# Patient Record
Sex: Male | Born: 1960 | Race: Black or African American | Hispanic: No | Marital: Single | Smoking: Never smoker
Health system: Southern US, Community
[De-identification: ages and names within clinical notes are randomized; demographics above are authoritative.]

## PROBLEM LIST (undated history)

## (undated) DIAGNOSIS — I10 Essential (primary) hypertension: Secondary | ICD-10-CM

## (undated) DIAGNOSIS — B20 Human immunodeficiency virus [HIV] disease: Secondary | ICD-10-CM

## (undated) DIAGNOSIS — M1990 Unspecified osteoarthritis, unspecified site: Secondary | ICD-10-CM

## (undated) DIAGNOSIS — C61 Malignant neoplasm of prostate: Secondary | ICD-10-CM

## (undated) HISTORY — PX: NO PAST SURGERIES: SHX2092

## (undated) HISTORY — PX: COLONOSCOPY: SHX174

## (undated) HISTORY — PX: TONSILLECTOMY: SUR1361

## (undated) HISTORY — DX: Malignant neoplasm of prostate: C61

---

## 2005-04-26 ENCOUNTER — Emergency Department: Payer: Self-pay | Admitting: Unknown Physician Specialty

## 2007-12-04 ENCOUNTER — Ambulatory Visit: Payer: Self-pay | Admitting: Family Medicine

## 2011-04-30 DIAGNOSIS — B20 Human immunodeficiency virus [HIV] disease: Secondary | ICD-10-CM | POA: Insufficient documentation

## 2012-05-31 ENCOUNTER — Ambulatory Visit: Payer: Self-pay | Admitting: Emergency Medicine

## 2013-03-05 ENCOUNTER — Ambulatory Visit: Payer: Self-pay | Admitting: Family Medicine

## 2013-03-24 ENCOUNTER — Ambulatory Visit: Payer: Self-pay | Admitting: Family Medicine

## 2013-04-15 DIAGNOSIS — E785 Hyperlipidemia, unspecified: Secondary | ICD-10-CM | POA: Insufficient documentation

## 2013-04-15 DIAGNOSIS — I10 Essential (primary) hypertension: Secondary | ICD-10-CM | POA: Insufficient documentation

## 2013-04-17 DIAGNOSIS — S83289A Other tear of lateral meniscus, current injury, unspecified knee, initial encounter: Secondary | ICD-10-CM | POA: Insufficient documentation

## 2014-05-28 DIAGNOSIS — M23329 Other meniscus derangements, posterior horn of medial meniscus, unspecified knee: Secondary | ICD-10-CM | POA: Insufficient documentation

## 2014-07-09 DIAGNOSIS — L819 Disorder of pigmentation, unspecified: Secondary | ICD-10-CM | POA: Insufficient documentation

## 2014-12-17 DIAGNOSIS — K6282 Dysplasia of anus: Secondary | ICD-10-CM | POA: Insufficient documentation

## 2015-02-03 ENCOUNTER — Emergency Department
Admit: 2015-02-03 | Disposition: A | Discharge status: Home / Self Care | Payer: Self-pay | Admitting: Emergency Medicine

## 2016-07-31 DIAGNOSIS — Z86010 Personal history of colonic polyps: Secondary | ICD-10-CM | POA: Insufficient documentation

## 2017-10-11 DIAGNOSIS — N528 Other male erectile dysfunction: Secondary | ICD-10-CM | POA: Insufficient documentation

## 2018-07-26 ENCOUNTER — Observation Stay
Admission: EM | Admit: 2018-07-26 | Discharge: 2018-07-27 | Disposition: A | Discharge status: Home / Self Care | Payer: BLUE CROSS/BLUE SHIELD | Attending: Internal Medicine | Admitting: Internal Medicine

## 2018-07-26 ENCOUNTER — Emergency Department: Payer: BLUE CROSS/BLUE SHIELD

## 2018-07-26 ENCOUNTER — Other Ambulatory Visit: Payer: Self-pay

## 2018-07-26 DIAGNOSIS — I10 Essential (primary) hypertension: Secondary | ICD-10-CM | POA: Diagnosis not present

## 2018-07-26 DIAGNOSIS — B20 Human immunodeficiency virus [HIV] disease: Secondary | ICD-10-CM | POA: Insufficient documentation

## 2018-07-26 DIAGNOSIS — Z79899 Other long term (current) drug therapy: Secondary | ICD-10-CM | POA: Diagnosis not present

## 2018-07-26 DIAGNOSIS — R079 Chest pain, unspecified: Secondary | ICD-10-CM | POA: Diagnosis present

## 2018-07-26 DIAGNOSIS — R0789 Other chest pain: Secondary | ICD-10-CM | POA: Diagnosis not present

## 2018-07-26 DIAGNOSIS — Z23 Encounter for immunization: Secondary | ICD-10-CM | POA: Diagnosis not present

## 2018-07-26 HISTORY — DX: Essential (primary) hypertension: I10

## 2018-07-26 LAB — HEPATIC FUNCTION PANEL
ALT: 16 U/L (ref 0–44)
AST: 21 U/L (ref 15–41)
Albumin: 4 g/dL (ref 3.5–5.0)
Alkaline Phosphatase: 53 U/L (ref 38–126)
Bilirubin, Direct: 0.2 mg/dL (ref 0.0–0.2)
Indirect Bilirubin: 0.7 mg/dL (ref 0.3–0.9)
Total Bilirubin: 0.9 mg/dL (ref 0.3–1.2)
Total Protein: 6.9 g/dL (ref 6.5–8.1)

## 2018-07-26 LAB — CBC
HEMATOCRIT: 42.5 % (ref 40.0–52.0)
HEMOGLOBIN: 14.7 g/dL (ref 13.0–18.0)
MCH: 31.7 pg (ref 26.0–34.0)
MCHC: 34.7 g/dL (ref 32.0–36.0)
MCV: 91.5 fL (ref 80.0–100.0)
Platelets: 215 10*3/uL (ref 150–440)
RBC: 4.64 MIL/uL (ref 4.40–5.90)
RDW: 14.4 % (ref 11.5–14.5)
WBC: 4.5 10*3/uL (ref 3.8–10.6)

## 2018-07-26 LAB — TROPONIN I: Troponin I: <0.03 ng/mL (ref <0.03)

## 2018-07-26 LAB — BASIC METABOLIC PANEL
Anion gap: 12 (ref 5–15)
BUN: 12 mg/dL (ref 6–20)
CO2: 20 mmol/L — ABNORMAL LOW (ref 22–32)
Calcium: 9 mg/dL (ref 8.9–10.3)
Chloride: 104 mmol/L (ref 98–111)
Creatinine, Ser: 1.17 mg/dL (ref 0.61–1.24)
GFR calc Af Amer: >60 mL/min (ref >60)
GFR calc non Af Amer: >60 mL/min (ref >60)
Glucose, Bld: 109 mg/dL — ABNORMAL HIGH (ref 70–99)
Potassium: 3.9 mmol/L (ref 3.5–5.1)
Sodium: 136 mmol/L (ref 135–145)

## 2018-07-26 LAB — LIPASE, BLOOD: Lipase: 36 U/L (ref 11–51)

## 2018-07-26 MED ORDER — INFLUENZA VAC SPLIT QUAD 0.5 ML IM SUSY
0.5000 mL | PREFILLED_SYRINGE | INTRAMUSCULAR | Status: AC
  Administered 2018-07-27: 0.5 mL via INTRAMUSCULAR
  Filled 2018-07-26: qty 0.5

## 2018-07-26 MED ORDER — HYDRALAZINE HCL 20 MG/ML IJ SOLN: 5.0000 mg | INTRAMUSCULAR | Status: DC | PRN

## 2018-07-26 MED ORDER — METOPROLOL TARTRATE 25 MG PO TABS
25.0000 mg | ORAL_TABLET | Freq: Two times a day (BID) | ORAL | Status: DC
  Administered 2018-07-26 – 2018-07-27 (×2): 25 mg via ORAL
  Filled 2018-07-26 (×2): qty 1

## 2018-07-26 MED ORDER — ASPIRIN 81 MG PO CHEW
81.0000 mg | CHEWABLE_TABLET | Freq: Every day | ORAL | Status: DC
  Administered 2018-07-27: 81 mg via ORAL
  Filled 2018-07-26: qty 1

## 2018-07-26 MED ORDER — ABACAVIR-DOLUTEGRAVIR-LAMIVUD 600-50-300 MG PO TABS
1.0000 | ORAL_TABLET | Freq: Every day | ORAL | Status: DC
  Administered 2018-07-26: 1 via ORAL
  Filled 2018-07-26 (×2): qty 1

## 2018-07-26 MED ORDER — GI COCKTAIL ~~LOC~~
30.0000 mL | Freq: Four times a day (QID) | ORAL | Status: DC | PRN
  Filled 2018-07-26: qty 30

## 2018-07-26 MED ORDER — SODIUM CHLORIDE 0.9% FLUSH
3.0000 mL | Freq: Two times a day (BID) | INTRAVENOUS | Status: DC
  Administered 2018-07-26: 3 mL via INTRAVENOUS

## 2018-07-26 MED ORDER — ACETAMINOPHEN 325 MG PO TABS: 650.0000 mg | ORAL_TABLET | ORAL | Status: DC | PRN

## 2018-07-26 MED ORDER — ONDANSETRON HCL 4 MG/2ML IJ SOLN: 4.0000 mg | Freq: Four times a day (QID) | INTRAMUSCULAR | Status: DC | PRN

## 2018-07-26 MED ORDER — LISINOPRIL-HYDROCHLOROTHIAZIDE 20-12.5 MG PO TABS: 1.0000 | ORAL_TABLET | Freq: Every day | ORAL | Status: DC

## 2018-07-26 MED ORDER — LISINOPRIL 20 MG PO TABS
20.0000 mg | ORAL_TABLET | Freq: Every day | ORAL | Status: DC
  Administered 2018-07-26 – 2018-07-27 (×2): 20 mg via ORAL
  Filled 2018-07-26 (×2): qty 1

## 2018-07-26 MED ORDER — ATORVASTATIN CALCIUM 10 MG PO TABS
10.0000 mg | ORAL_TABLET | Freq: Every day | ORAL | Status: DC
  Administered 2018-07-26 – 2018-07-27 (×2): 10 mg via ORAL
  Filled 2018-07-26 (×2): qty 1

## 2018-07-26 MED ORDER — HYDROCHLOROTHIAZIDE 12.5 MG PO CAPS
12.5000 mg | ORAL_CAPSULE | Freq: Every day | ORAL | Status: DC
  Administered 2018-07-26 – 2018-07-27 (×2): 12.5 mg via ORAL
  Filled 2018-07-26 (×2): qty 1

## 2018-07-26 MED ORDER — ENOXAPARIN SODIUM 40 MG/0.4ML ~~LOC~~ SOLN
40.0000 mg | SUBCUTANEOUS | Status: DC
  Administered 2018-07-26: 40 mg via SUBCUTANEOUS
  Filled 2018-07-26: qty 0.4

## 2018-07-26 NOTE — ED Triage Notes (Signed)
Pt arrived via ems from work c/o chest pain starting in the center radiating to the left arm. PT rates pain 5/10. No cardiac hx, pt received 4 ASA and one nitro spray at 1515. Vitals WNL: CBG-120, 129/74, 70, and 97% on RA. Pt NAD at present, respirations even and non labored.

## 2018-07-26 NOTE — ED Notes (Signed)
Transport to floor 236.AS

## 2018-07-26 NOTE — ED Provider Notes (Signed)
[facility] Emergency Department Provider Note  ____________________________________________   I have reviewed the triage vital signs and the nursing notes. Where available I have reviewed prior notes and, if possible and indicated, outside hospital notes.    HISTORY  Chief Complaint Chest Pain    HPI Francis Monroe is a 57 y.o. male who presents today complaining of chest pain, it was a substernal discomfort on the left side mostly which radiated to his left arm with a tingling sensation.  No fever not pleuritic no recent travel no history of PE or DVT no pneumonia symptoms, pain started around 230, he had nitroglycerin from EMS which helped him relieve the pain and by the time he came to me the pain was gone.  So is the tingling.  Is not had this before.  Does have a history of very well-controlled HIV.  His CD4 count is undetectable he states.  Patient states he does not have a history of CAD but he does have history of hypertension.  He does take medications every day.  He states that he did not have any exertional symptoms with this it began at rest.  He has not had this before that he can recall.  It was very poorly described chest discomfort.  "Felt like a burp but not like a burp"  Not tearing it was not at maximal insidious onset, no other radiation   Past Medical History:  Diagnosis Date  . Hypertension     There are no active problems to display for this patient.   History reviewed. No pertinent surgical history.  Prior to Admission medications   Not on File    Allergies Patient has no allergy information on record.  History reviewed. No pertinent family history.  Social History Social History   Tobacco Use  . Smoking status: Never Smoker  . Smokeless tobacco: Never Used  Substance Use Topics  . Alcohol use: Not on file  . Drug use: Not on file    Review of Systems Constitutional: No fever/chills Eyes: No visual changes. ENT: No  sore throat. No stiff neck no neck pain Cardiovascular: Denies chest pain. Respiratory: Denies shortness of breath. Gastrointestinal:   no vomiting.  No diarrhea.  No constipation. Genitourinary: Negative for dysuria. Musculoskeletal: Negative lower extremity swelling Skin: Negative for rash. Neurological: Negative for severe headaches, focal weakness or numbness.   ____________________________________________   PHYSICAL EXAM:  VITAL SIGNS: ED Triage Vitals  Enc Vitals Group     BP 07/26/18 1542 117/86     Pulse Rate 07/26/18 1542 70     Resp 07/26/18 1542 16     Temp 07/26/18 1542 98 F (36.7 C)     Temp Source 07/26/18 1542 Oral     SpO2 07/26/18 1542 94 %     Weight 07/26/18 1544 248 lb (112.5 kg)     Height 07/26/18 1544 5\' 11"  (1.803 m)     Head Circumference --      Peak Flow --      Pain Score 07/26/18 1544 4     Pain Loc --      Pain Edu? --      Excl. in [place]? --     Constitutional: Alert and oriented. Well appearing and in no acute distress. Eyes: Conjunctivae are normal Head: Atraumatic HEENT: No congestion/rhinnorhea. Mucous membranes are moist.  Oropharynx non-erythematous Neck:   Nontender with no meningismus, no masses, no stridor Cardiovascular: Normal rate, regular rhythm. Grossly normal heart sounds.  Good peripheral circulation. Respiratory: Normal respiratory effort.  No retractions. Lungs CTAB. Abdominal: Soft and nontender. No distention. No guarding no rebound Back:  There is no focal tenderness or step off.  there is no midline tenderness there are no lesions noted. there is no CVA tenderness Musculoskeletal: No lower extremity tenderness, no upper extremity tenderness. No joint effusions, no DVT signs strong distal pulses no edema Neurologic:  Normal speech and language. No gross focal neurologic deficits are appreciated.  Skin:  Skin is warm, dry and intact. No rash noted. Psychiatric: Mood and affect are normal. Speech and behavior are  normal.  ____________________________________________   LABS (all labs ordered are listed, but only abnormal results are displayed)  Labs Reviewed  BASIC METABOLIC PANEL - Abnormal; Notable for the following components:      Result Value   CO2 20 (*)    Glucose, Bld 109 (*)    All other components within normal limits  CBC  TROPONIN I  HEPATIC FUNCTION PANEL  LIPASE, BLOOD    Pertinent labs  results that were available during my care of the patient were reviewed by me and considered in my medical decision making (see chart for details). ____________________________________________  EKG  I personally interpreted any EKGs ordered by me or triage Normal sinus rhythm rate 72 bpm no acute ST elevation or depression, nonspecific ST changes, borderline LAD no acute ischemia ____________________________________________  RADIOLOGY  Pertinent labs & imaging results that were available during my care of the patient were reviewed by me and considered in my medical decision making (see chart for details). If possible, patient and/or family made aware of any abnormal findings.  Dg Chest 2 View  Result Date: 07/26/2018 CLINICAL DATA:  Chest pain EXAM: CHEST - 2 VIEW COMPARISON:  December 04, 2007 FINDINGS: Lungs are clear. Heart size and pulmonary vascularity are within normal limits. No pneumothorax. No adenopathy. No bone lesions. IMPRESSION: No edema or consolidation. Electronically Signed   By: Lowella Grip III M.D.   On: 07/26/2018 16:15   ____________________________________________    PROCEDURES  Procedure(s) performed: None  Procedures  Critical Care performed: None  ____________________________________________   INITIAL IMPRESSION / ASSESSMENT AND PLAN / ED COURSE  Pertinent labs & imaging results that were available during my care of the patient were reviewed by me and considered in my medical decision making (see chart for details).  Here with substernal and  left-sided chest pressure, associated with left arm tingling and numbness but no weakness, low suspicion for dissection no evidence of pneumonia.  Patient does have HIV which places with an increased risk of ACS.  His pain was relieved by nitro he does have a history of hypertension.  He and I discussed admission versus discharge he very strongly would prefer to be admitted does not feel safe going home after this event.  I will discuss with the hospitalist service    ____________________________________________   FINAL CLINICAL IMPRESSION(S) / ED DIAGNOSES  Final diagnoses:  Chest pain, unspecified type      This chart was dictated using voice recognition software.  Despite best efforts to proofread,  errors can occur which can change meaning.      Schuyler Amor, MD 07/26/18 [phone]

## 2018-07-26 NOTE — ED Notes (Signed)
ED Provider at bedside. 

## 2018-07-26 NOTE — H&P (Addendum)
[place] at [place] NAME: Francis Monroe    MR#:  [id number]  DATE OF BIRTH:  [birth year]  DATE OF ADMISSION:  07/26/2018  PRIMARY CARE PHYSICIAN: Foye Spurling, MD   REQUESTING/REFERRING PHYSICIAN: Charlotte Crumb, MD  CHIEF COMPLAINT:   Chief Complaint  Patient presents with  . Chest Pain    HISTORY OF PRESENT ILLNESS:  Francis Monroe  is a 57 y.o. male with a known history of hypertension and HIV who presented to the ED with chest pain that started today at 2:15pm. 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 He thought the pain was "just gas". The pain radiated to his left arm and his fingertips became numb. The pain continued, so his coworkers called 911. The pain is not worse with exertion. He did not have any associated diaphoresis, nausea, or shortness of breath. He has no history of heart issues. He is not currently having any active chest pain.  In the ED, initial troponin <0.03. EKG did not show any ST or T wave changes. Hospitalists were called for admission.  PAST MEDICAL HISTORY:   Past Medical History:  Diagnosis Date  . Hypertension     PAST SURGICAL HISTORY:  History reviewed. No pertinent surgical history.  SOCIAL HISTORY:   Social History   Tobacco Use  . Smoking status: Never Smoker  . Smokeless tobacco: Never Used  Substance Use Topics  . Alcohol use: Not on file    FAMILY HISTORY:  History reviewed. No pertinent family history.  DRUG ALLERGIES:  No Known Allergies  REVIEW OF SYSTEMS:   Review of Systems  Constitutional: Negative for chills and fever.  HENT: Negative for congestion and sore throat.   Eyes: Negative for blurred vision and double vision.  Respiratory: Negative for cough and shortness of breath.   Cardiovascular: Positive for chest pain. Negative for palpitations and leg swelling.  Gastrointestinal: Negative  for abdominal pain, nausea and vomiting.  Genitourinary: Negative for dysuria and frequency.  Musculoskeletal: Negative for back pain and neck pain.  Neurological: Negative for dizziness and headaches.  Psychiatric/Behavioral: Negative for depression. The patient is not nervous/anxious.     MEDICATIONS AT HOME:   Prior to Admission medications   Medication Sig Start Date End Date Taking? Authorizing Provider  atorvastatin (LIPITOR) 10 MG tablet Take 10 mg by mouth daily. 05/14/18  Yes [provider]  ibuprofen (IBU) 800 MG tablet Take 800 mg by mouth 3 (three) times daily as needed for mild pain or moderate pain.  07/09/14  Yes [provider]  lisinopril-hydrochlorothiazide (PRINZIDE,ZESTORETIC) 20-12.5 MG tablet Take 1 tablet by mouth daily. 07/23/18  Yes [provider]  TRIUMEQ 600-50-300 MG tablet Take 1 tablet by mouth daily. 07/19/18  Yes [provider]      VITAL SIGNS:  Blood pressure (!) 156/105, pulse 75, temperature 98 F (36.7 C), temperature source Oral, resp. rate 18, height 5\' 11"  (1.803 m), weight 112.5 kg, SpO2 99 %.  PHYSICAL EXAMINATION:  Physical Exam  GENERAL:  57 y.o.-year-old patient lying in the bed with no acute distress.  EYES: Pupils equal, round, reactive to light and accommodation. No scleral icterus. Extraocular muscles intact.  HEENT: Head atraumatic, normocephalic. Oropharynx and nasopharynx clear. Moist mucous membranes. NECK:  Supple, no jugular venous distention. No thyroid enlargement, no tenderness.  LUNGS: Normal breath sounds bilaterally, no  wheezing, rales,rhonchi or crepitation. No use of accessory muscles of respiration.  CARDIOVASCULAR: RRR, S1, S2 normal. No murmurs, rubs, or gallops.  ABDOMEN: Soft, nontender, nondistended. Bowel sounds present. No organomegaly or mass.  EXTREMITIES: No pedal edema, cyanosis, or clubbing.  NEUROLOGIC: Cranial nerves II through XII are intact. Muscle strength 5/5 in all  extremities. Sensation intact. Gait not checked.  PSYCHIATRIC: The patient is alert and oriented x 3.  SKIN: No obvious rash, lesion, or ulcer.   LABORATORY PANEL:   CBC Recent Labs  Lab 07/26/18 1546  WBC 4.5  HGB 14.7  HCT 42.5  PLT 215   ------------------------------------------------------------------------------------------------------------------  Chemistries  Recent Labs  Lab 07/26/18 1546 07/26/18 1554  NA 136  --   K 3.9  --   CL 104  --   CO2 20*  --   GLUCOSE 109*  --   BUN 12  --   CREATININE 1.17  --   CALCIUM 9.0  --   AST  --  21  ALT  --  16  ALKPHOS  --  53  BILITOT  --  0.9   ------------------------------------------------------------------------------------------------------------------  Cardiac Enzymes Recent Labs  Lab 07/26/18 1546  TROPONINI <0.03   ------------------------------------------------------------------------------------------------------------------  RADIOLOGY:  Dg Chest 2 View  Result Date: 07/26/2018 CLINICAL DATA:  Chest pain EXAM: CHEST - 2 VIEW COMPARISON:  December 04, 2007 FINDINGS: Lungs are clear. Heart size and pulmonary vascularity are within normal limits. No pneumothorax. No adenopathy. No bone lesions. IMPRESSION: No edema or consolidation. Electronically Signed   By: Lowella Grip III M.D.   On: 07/26/2018 16:15      IMPRESSION AND PLAN:   Chest pain- doesn't sound classic for cardiac chest pain, although he has never had any cardiac work-up in the past.  - cardiology consult - trend troponins - repeat EKG in the morning - start metoprolol and baby aspirin - will try GI cocktail - cardiac monitoring  HIV- stable. Follows with Dr. Rudi Coco as an outpatient. Recent viral load undetectable. - continue triumeq  Hyperglycemia- glucose 109 in the ED - check a1c  Hypertension- BPs elevated in the ED - continue home lisinopril-hctz - start metoprolol  - hydralazine prn  Hyperlipidemia- stable, recent  lipid panel with LDL 55 - continue home lipitor  All the records are reviewed and case discussed with ED provider. Management plans discussed with the patient, family and they are in agreement.  CODE STATUS: full  TOTAL TIME TAKING CARE OF THIS PATIENT: 45 minutes.    Berna Spare Codi Folkerts M.D on 07/26/2018 at 8:52 PM  Between 7am to 6pm - Pager - [phone]  After 6pm go to [email or web address] - Proofreader  [facility]  Office  [phone]  CC: Primary care physician; Foye Spurling, MD   Note: This dictation was prepared with Dragon dictation along with smaller phrase technology. Any transcriptional errors that result from this process are unintentional.

## 2018-07-27 ENCOUNTER — Observation Stay (HOSPITAL_BASED_OUTPATIENT_CLINIC_OR_DEPARTMENT_OTHER)
Admit: 2018-07-27 | Discharge: 2018-07-27 | Disposition: A | Discharge status: Home / Self Care | Payer: BLUE CROSS/BLUE SHIELD | Attending: Internal Medicine | Admitting: Internal Medicine

## 2018-07-27 DIAGNOSIS — R079 Chest pain, unspecified: Secondary | ICD-10-CM

## 2018-07-27 LAB — BASIC METABOLIC PANEL
ANION GAP: 6 (ref 5–15)
BUN: 11 mg/dL (ref 6–20)
CHLORIDE: 108 mmol/L (ref 98–111)
CO2: 25 mmol/L (ref 22–32)
Calcium: 8.6 mg/dL — ABNORMAL LOW (ref 8.9–10.3)
Creatinine, Ser: 1.15 mg/dL (ref 0.61–1.24)
GFR calc non Af Amer: >60 mL/min (ref >60)
GLUCOSE: 100 mg/dL — AB (ref 70–99)
POTASSIUM: 4.1 mmol/L (ref 3.5–5.1)
Sodium: 139 mmol/L (ref 135–145)

## 2018-07-27 LAB — TROPONIN I: Troponin I: <0.03 ng/mL (ref <0.03)

## 2018-07-27 LAB — CBC
HEMATOCRIT: 42.6 % (ref 40.0–52.0)
HEMOGLOBIN: 15 g/dL (ref 13.0–18.0)
MCH: 31.8 pg (ref 26.0–34.0)
MCHC: 35.3 g/dL (ref 32.0–36.0)
MCV: 90 fL (ref 80.0–100.0)
Platelets: 234 10*3/uL (ref 150–440)
RBC: 4.74 MIL/uL (ref 4.40–5.90)
RDW: 14.3 % (ref 11.5–14.5)
WBC: 6.1 10*3/uL (ref 3.8–10.6)

## 2018-07-27 LAB — ECHOCARDIOGRAM COMPLETE
Height: 71 in
Weight: 3960 oz

## 2018-07-27 NOTE — Progress Notes (Signed)
Advanced care plan.  Purpose of the Encounter: CODE STATUS  Parties in Attendance: Patient  Patient's Decision Capacity: Good  Subjective/Patient's story: Presented to emergency room for chest pain   Objective/Medical story Patient has a history of HIV disease Needs cardiology evaluation work-up   Goals of care determination:  Advance care directives and goals of care discussed Patient wants everything done which includes cpr, intubation and ventilator if need arises  CODE STATUS: Full code   Time spent discussing advanced care planning: 16 minutes

## 2018-07-27 NOTE — Discharge Summary (Signed)
[place] at [place] NAME: Francis Monroe    MR#:  [id number]  DATE OF BIRTH:  20-Apr-1961  DATE OF ADMISSION:  07/26/2018 ADMITTING PHYSICIAN: Sela Hua, MD  DATE OF DISCHARGE: 07/27/2018 12:14 PM  PRIMARY CARE PHYSICIAN: Foye Spurling, MD   ADMISSION DIAGNOSIS:  Chest pain, unspecified type [R07.9] Hypertension HIV disease DISCHARGE DIAGNOSIS:  Atypical chest pain Hypertension HIV disease  SECONDARY DIAGNOSIS:   Past Medical History:  Diagnosis Date  . Hypertension      ADMITTING HISTORY Francis Monroe  is a 57 y.o. male with a known history of hypertension and HIV who presented to the ED with chest pain that started today at 2:15pm. 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 He thought the pain was "just gas". The pain radiated to his left arm and his fingertips became numb. The pain continued, so his coworkers called 911. The pain is not worse with exertion. He did not have any associated diaphoresis, nausea, or shortness of breath. He has no history of heart issues. He is not currently having any active chest pain.In the ED, initial troponin <0.03. EKG did not show any ST or T wave changes. Hospitalists were called for admission.  HOSPITAL COURSE:  Patient admitted to telemetry.  Telemetry monitoring was uneventful.  Serial troponins were all negative.  Chest pain completely resolved.  Patient was worked up with echocardiogram which showed EF of 65%.  CONSULTS OBTAINED:  None  DRUG ALLERGIES:  No Known Allergies  DISCHARGE MEDICATIONS:   Allergies as of 07/27/2018   No Known Allergies     Medication List    TAKE these medications   atorvastatin 10 MG tablet Commonly known as:  LIPITOR Take 10 mg by mouth daily.   IBU 800 MG tablet Generic drug:  ibuprofen Take 800 mg by mouth 3 (three) times daily as needed for mild pain or moderate  pain.   lisinopril-hydrochlorothiazide 20-12.5 MG tablet Commonly known as:  PRINZIDE,ZESTORETIC Take 1 tablet by mouth daily.   TRIUMEQ 600-50-300 MG tablet Generic drug:  abacavir-dolutegravir-lamiVUDine Take 1 tablet by mouth daily.       Today  Patient seen and evaluated today No chest pain No shortness of breath  VITAL SIGNS:  Blood pressure (!) 132/94, pulse 66, temperature 97.7 F (36.5 C), temperature source Oral, resp. rate 20, height 5\' 11"  (1.803 m), weight 112.3 kg, SpO2 96 %.  I/O:    Intake/Output Summary (Last 24 hours) at 07/27/2018 1359 Last data filed at 07/27/2018 1044 Gross per 24 hour  Intake -  Output 575 ml  Net -575 ml    PHYSICAL EXAMINATION:  Physical Exam  GENERAL:  57 y.o.-year-old patient lying in the bed with no acute distress.  LUNGS: Normal breath sounds bilaterally, no wheezing, rales,rhonchi or crepitation. No use of accessory muscles of respiration.  CARDIOVASCULAR: S1, S2 normal. No murmurs, rubs, or gallops.  ABDOMEN: Soft, non-tender, non-distended. Bowel sounds present. No organomegaly or mass.  NEUROLOGIC: Moves all 4 extremities. PSYCHIATRIC: The patient is alert and oriented x 3.  SKIN: No obvious rash, lesion, or ulcer.   DATA REVIEW:   CBC Recent Labs  Lab 07/27/18 0403  WBC 6.1  HGB 15.0  HCT 42.6  PLT 234    Chemistries  Recent Labs  Lab 07/26/18 1554 07/27/18 0528  NA  --  139  K  --  4.1  CL  --  108  CO2  --  25  GLUCOSE  --  100*  BUN  --  11  CREATININE  --  1.15  CALCIUM  --  8.6*  AST 21  --   ALT 16  --   ALKPHOS 53  --   BILITOT 0.9  --     Cardiac Enzymes Recent Labs  Lab 07/27/18 0403  TROPONINI <0.03    Microbiology Results  No results found for this or any previous visit.  RADIOLOGY:  Dg Chest 2 View  Result Date: 07/26/2018 CLINICAL DATA:  Chest pain EXAM: CHEST - 2 VIEW COMPARISON:  December 04, 2007 FINDINGS: Lungs are clear. Heart size and pulmonary vascularity are  within normal limits. No pneumothorax. No adenopathy. No bone lesions. IMPRESSION: No edema or consolidation. Electronically Signed   By: Lowella Grip III M.D.   On: 07/26/2018 16:15    Follow up with PCP in 1 week.  Management plans discussed with the patient, family and they are in agreement.  CODE STATUS: Full code    Code Status Orders  (From admission, onward)         Start     Ordered   07/26/18 2201  Full code  Continuous     07/26/18 2200        Code Status History    This patient has a current code status but no historical code status.      TOTAL TIME TAKING CARE OF THIS PATIENT ON DAY OF DISCHARGE: more than 34 minutes.   Saundra Shelling M.D on 07/27/2018 at 1:59 PM  Between 7am to 6pm - Pager - [phone]  After 6pm go to [email or web address] - password [email or web address] [facility]  [facility]  Office  [phone]  CC: Primary care physician; Foye Spurling, MD  Note: This dictation was prepared with Dragon dictation along with smaller phrase technology. Any transcriptional errors that result from this process are unintentional.

## 2018-07-28 LAB — HEMOGLOBIN A1C
Hgb A1c MFr Bld: 5.8 % — ABNORMAL HIGH (ref 4.8–5.6)
MEAN PLASMA GLUCOSE: 120 mg/dL

## 2018-07-29 LAB — HIV 1/2 AB DIFFERENTIATION
HIV 1 Ab: POSITIVE — AB
HIV 2 AB: NEGATIVE

## 2018-07-29 LAB — HIV ANTIBODY (ROUTINE TESTING W REFLEX): HIV SCREEN 4TH GENERATION: REACTIVE — AB

## 2018-09-10 ENCOUNTER — Telehealth: Payer: Self-pay | Admitting: *Deleted

## 2018-09-10 NOTE — Telephone Encounter (Signed)
Patient information sent to [facility] for positive + follow up.

## 2019-07-08 ENCOUNTER — Ambulatory Visit
Admission: EM | Admit: 2019-07-08 | Discharge: 2019-07-08 | Disposition: A | Discharge status: Home / Self Care | Payer: BC Managed Care – PPO | Attending: Family Medicine | Admitting: Family Medicine

## 2019-07-08 ENCOUNTER — Other Ambulatory Visit: Payer: Self-pay

## 2019-07-08 ENCOUNTER — Encounter: Payer: Self-pay | Admitting: Emergency Medicine

## 2019-07-08 DIAGNOSIS — M25512 Pain in left shoulder: Secondary | ICD-10-CM | POA: Diagnosis not present

## 2019-07-08 DIAGNOSIS — M62838 Other muscle spasm: Secondary | ICD-10-CM | POA: Diagnosis not present

## 2019-07-08 MED ORDER — METHYLPREDNISOLONE SODIUM SUCC 40 MG IJ SOLR
80.0000 mg | Freq: Once | INTRAMUSCULAR | Status: AC
  Administered 2019-07-08: 80 mg via INTRAMUSCULAR

## 2019-07-08 MED ORDER — METAXALONE 800 MG PO TABS: 800.0000 mg | ORAL_TABLET | Freq: Three times a day (TID) | ORAL | 0 refills | Status: DC | PRN

## 2019-07-08 MED ORDER — MELOXICAM 15 MG PO TABS: 15.0000 mg | ORAL_TABLET | Freq: Every day | ORAL | 0 refills | Status: DC | PRN

## 2019-07-08 NOTE — ED Triage Notes (Signed)
Pt c/o left shoulder pain. Started about 3 days ago. No known injury. He states it is hard to lift his arm sometimes. Pain radiates up into his neck.

## 2019-07-08 NOTE — ED Provider Notes (Addendum)
[facility]    CSN: [id number] Arrival date & time: 07/08/19  [id number]  History   Chief Complaint Chief Complaint  Patient presents with  . Shoulder Pain    left   HPI  58 year old male presents with left shoulder pain.  Patient reports that his shoulder pain started on Sunday.  Has progressed and has not improved.  Patient reports decreased range of motion secondary to pain.  Patient also reports that his pain interferes with sleep.  He rates his pain is 10/10 in severity currently.  Patient also reports pain of the trapezius muscle.  No recent fall, trauma, injury.  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  He has tried Motrin, hydrocodone, heat, and ice without resolution.  Exacerbated by activity.  No reports of paresthesias.  No other associated symptoms.  No other complaints.  PMH, Surgical Hx, Family Hx, Social History reviewed and updated as below.  PMH: HLD, HIV, HTN, Anal dysplasia  Surgical Hx: TONSILLECTOMY      PR COLSC FLX W/RMVL OF TUMOR POLYP LESION SNARE TQ 03/13/2016 N/A Procedure: COLONOSCOPY FLEX; W/REMOV TUMOR/LES BY SNARE; Surgeon: Selina Cooley, MD; Location: [facility]; Service: Gastroenterology     Home Medications    Prior to Admission medications   Medication Sig Start Date End Date Taking? Authorizing Provider  atorvastatin (LIPITOR) 10 MG tablet Take 10 mg by mouth daily. 05/14/18  Yes [provider]  DOVATO 50-300 MG TABS  06/12/19   [provider]  losartan (COZAAR) 25 MG tablet  06/11/19   [provider]  meloxicam (MOBIC) 15 MG tablet Take 1 tablet (15 mg total) by mouth daily as needed for pain. 07/08/19   Coral Spikes, DO  metaxalone (SKELAXIN) 800 MG tablet Take 1 tablet (800 mg total) by mouth 3 (three) times daily as needed for muscle spasms. 07/08/19   Coral Spikes, DO  lisinopril-hydrochlorothiazide (PRINZIDE,ZESTORETIC) 20-12.5 MG tablet Take 1 tablet by mouth daily. 07/23/18  07/08/19  [provider]    Family History Family History  Problem Relation Age of Onset  . Healthy Mother   . Stroke Father     Social History Social History   Tobacco Use  . Smoking status: Never Smoker  . Smokeless tobacco: Never Used  Substance Use Topics  . Alcohol use: Not Currently  . Drug use: Not Currently     Allergies   Patient has no known allergies.   Review of Systems Review of Systems  Constitutional: Negative.   Musculoskeletal:       Left shoulder pain.   All other systems reviewed and are negative.  Physical Exam Triage Vital Signs ED Triage Vitals  Enc Vitals Group     BP 07/08/19 0934 (!) 182/115     Pulse Rate 07/08/19 0934 72     Resp 07/08/19 0934 18     Temp 07/08/19 0934 98.1 F (36.7 C)     Temp Source 07/08/19 0934 Oral     SpO2 07/08/19 0934 97 %     Weight 07/08/19 0932 255 lb (115.7 kg)     Height 07/08/19 0932 5\' 11"  (1.803 m)     Head Circumference --      Peak Flow --      Pain Score 07/08/19 0932 10     Pain Loc --      Pain Edu? --      Excl. in [place]? --  Updated Vital Signs BP (!) 182/115 (BP Location: Right Arm)   Pulse 72   Temp 98.1 F (36.7 C) (Oral)   Resp 18   Ht 5\' 11"  (1.803 m)   Wt 115.7 kg   SpO2 97%   BMI 35.57 kg/m   Visual Acuity Right Eye Distance:   Left Eye Distance:   Bilateral Distance:    Right Eye Near:   Left Eye Near:    Bilateral Near:     Physical Exam Vitals signs and nursing note reviewed.  Constitutional:      Appearance: He is obese. He is not ill-appearing.     Comments: Appears uncomfortable/in pain.  HENT:     Head: Normocephalic and atraumatic.  Eyes:     General:        Right eye: No discharge.        Left eye: No discharge.     Conjunctiva/sclera: Conjunctivae normal.  Cardiovascular:     Rate and Rhythm: Normal rate and regular rhythm.     Heart sounds: No murmur.  Pulmonary:     Effort: Pulmonary effort is normal.     Breath sounds: Normal breath  sounds. No wheezing, rhonchi or rales.  Musculoskeletal:     Comments: Shoulder: Left Inspection reveals no abnormalities, atrophy or asymmetry. Palpation is normal with no tenderness over AC joint or bicipital groove. ROM decreased in flexion. Rotator cuff strength 4/5 supraspinatus. Remainder normal. + Hawkin's tests, empty can.   Skin:    General: Skin is warm.     Findings: No rash.  Neurological:     Mental Status: He is alert.  Psychiatric:        Mood and Affect: Mood normal.        Behavior: Behavior normal.    UC Treatments / Results  Labs (all labs ordered are listed, but only abnormal results are displayed) Labs Reviewed - No data to display  EKG   Radiology No results found.  Procedures Procedures (including critical care time) Subacromial corticosteroid injection Verbal consent obtained.  Medication:  80 mg Solumedrol, 4cc Lidocaine 1% without epi Preparation: area cleansed with alcohol x 3   Injection: Landmarks identified Above medication injected using a standard posterior approach. Patient tolerated well without bleeding or paresthesias  Patient had good range of motion of joint after injection  Medications Ordered in UC Medications  methylPREDNISolone sodium succinate (SOLU-MEDROL) 40 mg/mL injection 80 mg (80 mg Intramuscular Given 07/08/19 1001)    Initial Impression / Assessment and Plan / UC Course  I have reviewed the triage vital signs and the nursing notes.  Pertinent labs & imaging results that were available during my care of the patient were reviewed by me and considered in my medical decision making (see chart for details).    58 year old male presents with shoulder pain.  Suspect rotator cuff pathology as well as trapezius muscle spasm.  Shoulder injection done today.  Sending home on meloxicam and Skelaxin.  Follow-up with orthopedics if fails to improve or worsens.  Final Clinical Impressions(s) / UC Diagnoses   Final diagnoses:   Acute pain of left shoulder  Trapezius muscle spasm     Discharge Instructions     Rest.  Medication as directed.  See [facility] or [facility] if persists.  Take care  Dr. Lacinda Axon    ED Prescriptions    Medication Sig Dispense Auth. Provider   meloxicam (MOBIC) 15 MG tablet Take 1 tablet (15 mg total) by mouth daily  as needed for pain. 30 tablet Samaira Holzworth G, DO   metaxalone (SKELAXIN) 800 MG tablet Take 1 tablet (800 mg total) by mouth 3 (three) times daily as needed for muscle spasms. 30 tablet Coral Spikes, DO     Controlled Substance Prescriptions [place] Controlled Substance Registry consulted? Not Applicable   Coral Spikes, DO 07/08/19 1038    [address] [place], [place] 07/08/19 1040

## 2019-07-08 NOTE — Discharge Instructions (Signed)
Rest.  Medication as directed.  See [facility] or [facility] if persists.  Take care  Dr. Lacinda Axon

## 2019-07-09 DIAGNOSIS — M5030 Other cervical disc degeneration, unspecified cervical region: Secondary | ICD-10-CM | POA: Insufficient documentation

## 2019-07-09 DIAGNOSIS — E669 Obesity, unspecified: Secondary | ICD-10-CM | POA: Insufficient documentation

## 2020-11-08 DIAGNOSIS — M4802 Spinal stenosis, cervical region: Secondary | ICD-10-CM | POA: Insufficient documentation

## 2020-11-08 DIAGNOSIS — M542 Cervicalgia: Secondary | ICD-10-CM | POA: Insufficient documentation

## 2020-11-08 DIAGNOSIS — M5412 Radiculopathy, cervical region: Secondary | ICD-10-CM | POA: Insufficient documentation

## 2020-11-11 DIAGNOSIS — E538 Deficiency of other specified B group vitamins: Secondary | ICD-10-CM | POA: Insufficient documentation

## 2020-11-11 DIAGNOSIS — R972 Elevated prostate specific antigen [PSA]: Secondary | ICD-10-CM | POA: Insufficient documentation

## 2020-11-11 DIAGNOSIS — E038 Other specified hypothyroidism: Secondary | ICD-10-CM | POA: Insufficient documentation

## 2020-11-11 DIAGNOSIS — R7303 Prediabetes: Secondary | ICD-10-CM | POA: Insufficient documentation

## 2020-11-24 ENCOUNTER — Encounter: Payer: Self-pay | Admitting: Urology

## 2020-11-24 ENCOUNTER — Ambulatory Visit (INDEPENDENT_AMBULATORY_CARE_PROVIDER_SITE_OTHER): Payer: BC Managed Care – PPO | Admitting: Urology

## 2020-11-24 ENCOUNTER — Other Ambulatory Visit: Payer: Self-pay

## 2020-11-24 VITALS — BP 174/113 | HR 81 | Ht 71.0 in | Wt 245.6 lb

## 2020-11-24 DIAGNOSIS — N529 Male erectile dysfunction, unspecified: Secondary | ICD-10-CM | POA: Diagnosis not present

## 2020-11-24 DIAGNOSIS — R972 Elevated prostate specific antigen [PSA]: Secondary | ICD-10-CM

## 2020-11-24 NOTE — Patient Instructions (Signed)
Prostate Cancer Screening  Prostate cancer screening is a test that is done to check for the presence of prostate cancer in men. The prostate gland is a walnut-sized gland that is located below the bladder and in front of the rectum in males. The function of the prostate is to add fluid to semen during ejaculation. Prostate cancer is the second most common type of cancer in men. Who should have prostate cancer screening?  Screening recommendations vary based on age and other risk factors. Screening is recommended if:  You are older than age 55. If you are age 61-69, talk with your health care provider about your need for screening and how often screening should be done. Because most prostate cancers are slow growing and will not cause death, screening is generally reserved in this age group for men who have a 10-15-year life expectancy.  You are younger than age 55, and you have these risk factors: ? Being a black male or a male of African descent. ? Having a father, brother, or uncle who has been diagnosed with prostate cancer. The risk is higher if your family member's cancer occurred at an early age. Screening is not recommended if:  You are younger than age 40.  You are between the ages of 40 and 54 and you have no risk factors.  You are 61 years of age or older. At this age, the risks that screening can cause are greater than the benefits that it may provide. If you are at high risk for prostate cancer, your health care provider may recommend that you have screenings more often or that you start screening at a younger age. How is screening for prostate cancer done? The recommended prostate cancer screening test is a blood test called the prostate-specific antigen (PSA) test. PSA is a protein that is made in the prostate. As you age, your prostate naturally produces more PSA. Abnormally high PSA levels may be caused by:  Prostate cancer.  An enlarged prostate that is not caused by cancer  (benign prostatic hyperplasia, BPH). This condition is very common in older men.  A prostate gland infection (prostatitis). Depending on the PSA results, you may need more tests, such as:  A physical exam to check the size of your prostate gland.  Blood and imaging tests.  A procedure to remove tissue samples from your prostate gland for testing (biopsy). What are the benefits of prostate cancer screening?  Screening can help to identify cancer at an early stage, before symptoms start and when the cancer can be treated more easily.  There is a small chance that screening may lower your risk of dying from prostate cancer. The chance is small because prostate cancer is a slow-growing cancer, and most men with prostate cancer die from a different cause. What are the risks of prostate cancer screening? The main risk of prostate cancer screening is diagnosing and treating prostate cancer that would never have caused any symptoms or problems. This is called overdiagnosisand overtreatment. PSA screening cannot tell you if your PSA is high due to cancer or a different cause. A prostate biopsy is the only procedure to diagnose prostate cancer. Even the results of a biopsy may not tell you if your cancer needs to be treated. Slow-growing prostate cancer may not need any treatment other than monitoring, so diagnosing and treating it may cause unnecessary stress or other side effects. A prostate biopsy may also cause:  Infection or fever.  A false negative. This is   a result that shows that you do not have prostate cancer when you actually do have prostate cancer. Questions to ask your health care provider  When should I start prostate cancer screening?  What is my risk for prostate cancer?  How often do I need screening?  What type of screening tests do I need?  How do I get my test results?  What do my results mean?  Do I need treatment? Where to find more information  The American Cancer  Society: www.cancer.org  American Urological Association: www.auanet.org Contact a health care provider if:  You have difficulty urinating.  You have pain when you urinate or ejaculate.  You have blood in your urine or semen.  You have pain in your back or in the area of your prostate. Summary  Prostate cancer is a common type of cancer in men. The prostate gland is located below the bladder and in front of the rectum. This gland adds fluid to semen during ejaculation.  Prostate cancer screening may identify cancer at an early stage, when the cancer can be treated more easily.  The prostate-specific antigen (PSA) test is the recommended screening test for prostate cancer.  Discuss the risks and benefits of prostate cancer screening with your health care provider. If you are age 61 or older, the risks that screening can cause are greater than the benefits that it may provide. This information is not intended to replace advice given to you by your health care provider. Make sure you discuss any questions you have with your health care provider. Document Revised: 02/13/2020 Document Reviewed: 06/05/2019 Elsevier Patient Education  [place].

## 2020-11-24 NOTE — Progress Notes (Signed)
   11/24/20 11:11 AM   Francis Monroe [birth year] [id number]  CC: Elevated PSA  HPI: I saw Francis Monroe in urology clinic for an elevated PSA of 6.02.  He is a 60 year old African-American male with no family history of prostate cancer, well-managed HIV, who has a single elevated PSA of 6.02 from 11/08/2020 on routine screening.  He denies any family history of prostate or breast cancer.  He has used Cialis in the past for erections, but has not used it over the last 2 to 3 years.  He has never undergone prostate biopsy previously.  I reviewed the outside notes from, infectious disease, general surgery.  PMH: HIV Hypertension  Family History: Family History  Problem Relation Age of Onset  . Healthy Mother   . Stroke Father     Social History:  reports that he has never smoked. He has never used smokeless tobacco. He reports previous alcohol use. He reports previous drug use.  Physical Exam: BP (!) 174/113 (BP Location: Left Arm, Patient Position: Sitting, Cuff Size: Large)   Pulse 81   Ht 5\' 11"  (1.803 m)   Wt 245 lb 9.6 oz (111.4 kg)   BMI 34.25 kg/m    Constitutional:  Alert and oriented, No acute distress. Cardiovascular: No clubbing, cyanosis, or edema. Respiratory: Normal respiratory effort, no increased work of breathing. GI: Abdomen is soft, nontender, nondistended, no abdominal masses GU: 30 g, smooth, no masses or nodules  Laboratory Data: Reviewed, see HPI  Pertinent Imaging: None to review  Assessment & Plan:   60 year old African-American male with no family history of prostate cancer, benign DRE, well-managed HIV, and single elevated PSA of 6.02.  We reviewed the implications of an elevated PSA and the uncertainty surrounding it. In general, a man's PSA increases with age and is produced by both normal and cancerous prostate tissue. The differential diagnosis for elevated PSA includes BPH, prostate cancer, infection, recent intercourse/ejaculation, recent  urethroscopic manipulation (foley placement/cystoscopy) or trauma, and prostatitis.   Management of an elevated PSA can include observation or prostate biopsy and we discussed this in detail. Our goal is to detect clinically significant prostate cancers, and manage with either active surveillance, surgery, or radiation for localized disease. Risks of prostate biopsy include bleeding, infection (including life threatening sepsis), pain, and lower urinary symptoms. Hematuria, hematospermia, and blood in the stool are all common after biopsy and can persist up to 4 weeks.   Repeat PSA with reflex to free, call with results-pursue prostate biopsy if remains elevated  Francis Madrid, MD 11/24/2020  [facility] [address] [place], [place] [address] [phone]

## 2020-12-02 ENCOUNTER — Other Ambulatory Visit: Payer: BC Managed Care – PPO

## 2020-12-02 DIAGNOSIS — R972 Elevated prostate specific antigen [PSA]: Secondary | ICD-10-CM

## 2020-12-03 ENCOUNTER — Telehealth: Payer: Self-pay

## 2020-12-03 LAB — PSA TOTAL (REFLEX TO FREE): Prostate Specific Ag, Serum: 6.3 ng/mL — ABNORMAL HIGH (ref 0.0–4.0)

## 2020-12-03 LAB — FPSA% REFLEX
% FREE PSA: 14.1 %
PSA, FREE: 0.89 ng/mL

## 2020-12-03 NOTE — Telephone Encounter (Signed)
Called pt informed him of the information below. Pt gave verbal understanding. Pt scheduled for prostate biopsy, result appointment scheduled. Pt not on any blood thinners. Bx instructions reviewed with pt in detail, copy mailed as well. Pt voiced understanding.

## 2020-12-03 NOTE — Telephone Encounter (Signed)
-----   Message from [name], MD sent at 12/03/2020  7:18 AM EST ----- PSA remains elevated, please schedule prostate biopsy and review instructions,thanks  Nickolas Madrid, MD 12/03/2020

## 2020-12-20 ENCOUNTER — Ambulatory Visit (INDEPENDENT_AMBULATORY_CARE_PROVIDER_SITE_OTHER): Payer: BC Managed Care – PPO | Admitting: Urology

## 2020-12-20 ENCOUNTER — Encounter: Payer: Self-pay | Admitting: Urology

## 2020-12-20 ENCOUNTER — Other Ambulatory Visit: Payer: Self-pay

## 2020-12-20 VITALS — BP 171/87 | HR 78 | Ht 71.0 in | Wt 245.0 lb

## 2020-12-20 DIAGNOSIS — R972 Elevated prostate specific antigen [PSA]: Secondary | ICD-10-CM

## 2020-12-20 MED ORDER — GENTAMICIN SULFATE 40 MG/ML IJ SOLN
80.0000 mg | Freq: Once | INTRAMUSCULAR | Status: AC
  Administered 2020-12-20: 80 mg via INTRAMUSCULAR

## 2020-12-20 MED ORDER — LEVOFLOXACIN 500 MG PO TABS
500.0000 mg | ORAL_TABLET | Freq: Once | ORAL | Status: AC
  Administered 2020-12-20: 500 mg via ORAL

## 2020-12-20 NOTE — Patient Instructions (Signed)

## 2020-12-20 NOTE — Progress Notes (Signed)
   12/20/20  Indication: Elevated PSA, 6.3(14% free)  Prostate Biopsy Procedure   Informed consent was obtained, and we discussed the risks of bleeding and infection/sepsis. A time out was performed to ensure correct patient identity.  Pre-Procedure: - Last PSA Level: 6.3(14% free) - Gentamicin and levaquin given for antibiotic prophylaxis - Transrectal Ultrasound performed revealing a 33g gm prostate, PSA density 0.19 - No significant hypoechoic or median lobe noted  Procedure: - Prostate block performed using 10 cc 1% lidocaine and biopsies taken from sextant areas, a total of 12 under ultrasound guidance.  Post-Procedure: - Patient tolerated the procedure well - He was counseled to seek immediate medical attention if experiences significant bleeding, fevers, or severe pain - Return in one week to discuss biopsy results  Assessment/ Plan: Will follow up in 1-2 weeks to discuss pathology  Nickolas Madrid, MD 12/20/2020

## 2020-12-21 LAB — SURGICAL PATHOLOGY

## 2020-12-29 ENCOUNTER — Ambulatory Visit (INDEPENDENT_AMBULATORY_CARE_PROVIDER_SITE_OTHER): Payer: BC Managed Care – PPO | Admitting: Urology

## 2020-12-29 ENCOUNTER — Encounter: Payer: Self-pay | Admitting: Urology

## 2020-12-29 ENCOUNTER — Other Ambulatory Visit: Payer: Self-pay

## 2020-12-29 VITALS — BP 174/112 | HR 75 | Ht 71.0 in | Wt 244.0 lb

## 2020-12-29 DIAGNOSIS — C61 Malignant neoplasm of prostate: Secondary | ICD-10-CM | POA: Diagnosis not present

## 2020-12-29 DIAGNOSIS — N529 Male erectile dysfunction, unspecified: Secondary | ICD-10-CM

## 2020-12-29 NOTE — Patient Instructions (Signed)
Prostate Cancer  The prostate is a male gland that helps make semen. It is located below a man's bladder, in front of the rectum. Prostate cancer is when abnormal cells grow in this gland. What are the causes? The cause of this condition is not known. What increases the risk? You are more likely to develop this condition if:  You are 61 years of age or older.  You are African American.  You have a family history of prostate cancer.  You have a family history of breast cancer. What are the signs or symptoms? Symptoms of this condition include:  A need to pee often.  Peeing that is weak, or pee that stops and starts.  Trouble starting or stopping your pee.  Inability to pee.  Blood in your pee or semen.  Pain in the lower back, lower belly (abdomen), hips, or upper thighs.  Trouble getting an erection.  Trouble emptying all of your pee. How is this treated? Treatment for this condition depends on your age, your health, the kind of treatment you like, and how far the cancer has spread. Treatments include:  Being watched. This is called observation. You will be tested from time to time, but you will not get treated. Tests are to make sure that the cancer is not growing.  Surgery. This may be done to remove the prostate, to remove the testicles, or to freeze or kill cancer cells.  Radiation. This uses a strong beam to kill cancer cells.  Ultrasound energy. This uses strong sound waves to kill cancer cells.  Chemotherapy. This uses medicines that stop cancer cells from increasing. This kills cancer cells and healthy cells.  Targeted therapy. This kills cancer cells only. Healthy cells are not affected.  Hormone treatment. This stops the body from making hormones that help the cancer cells to grow. Follow these instructions at home:  Take over-the-counter and prescription medicines only as told by your doctor.  Eat a healthy diet.  Get plenty of sleep.  Ask your  doctor for help to find a support group for men with prostate cancer.  If you have to go to the hospital, let your cancer doctor (oncologist) know.  Treatment may affect your ability to have sex. Touch, hold, hug, and caress your partner to have intimate moments.  Keep all follow-up visits as told by your doctor. This is important. Contact a doctor if:  You have new or more trouble peeing.  You have new or more blood in your pee.  You have new or more pain in your hips, back, or chest. Get help right away if:  You have weakness in your legs.  You lose feeling in your legs.  You cannot control your pee or your poop (stool).  You have chills or a fever. Summary  The prostate is a male gland that helps make semen.  Prostate cancer is when abnormal cells grow in this gland.  Treatment includes doing surgery, using medicines, using very strong beams, or watching without treatment.  Ask your doctor for help to find a support group for men with prostate cancer.  Contact a doctor if you have problems peeing or have any new pain that you did not have before. This information is not intended to replace advice given to you by your health care provider. Make sure you discuss any questions you have with your health care provider. Document Revised: 10/07/2019 Document Reviewed: 10/07/2019 Elsevier Patient Education  [place] Laparoscopic Prostatectomy  Robot-assisted laparoscopic prostatectomy is a minimally invasive procedure to remove the entire prostate, or prostate gland, and the seminal vesicles. The seminal vesicles are near the bladder and the prostate. This procedure is done to treat prostate cancer that has not spread to other parts of the body (has not metastasized). The goals of the procedure are to remove all cancer cells and to prevent prostate cancer from metastasizing. This procedure involves use of a thin, lighted tube with a tiny camera on the  end (laparoscope). The laparoscope will allow your surgeon to do the surgery with several small incisions in your abdomen instead of a large incision. Your surgeon will also use robotic arms during the procedure that will be controlled through a computer. During your procedure, the lymph nodes in your pelvis may be removed. Lymph nodes, also called lymph glands, are part of your body's disease-fighting system (immune system). The lymph nodes in the pelvis may be the first place that is affected by the spreading of prostate cancer. If your pelvic lymph nodes are removed, tissue from the nodes will be tested for cancer cells. Tell a health care provider about:  Any allergies you have.  All medicines you are taking, including vitamins, herbs, eye drops, creams, and over-the-counter medicines.  Any problems you or family members have had with anesthetic medicines.  Any blood disorders you have.  Any surgeries you have had.  Any medical conditions you have.  Any prostate infections you have had. What are the risks? Generally, this is a safe procedure. However, problems may occur, including:  Infection.  Bleeding.  Allergic reactions to medicines.  Problems that affect urination or sexual function. These may include: ? Inability to control when you urinate (incontinence). This is usually temporary. ? Narrowing or scarring (stricture) of the urethra, which is the part that drains urine from your bladder. Stricture may block the flow of urine. ? Inability to get or keep an erection (erectile dysfunction). ? Dry ejaculation. This is when no semen is released during orgasm.  Blockage (obstruction) of the large intestine or small intestine.  Blood clots in the legs.  The formation of a lymphocele. This is a sac (cyst) in the pelvis that is filled with fluid from the lymph nodes.  Damage to nearby structures or organs. What happens before the procedure? Staying hydrated Follow  instructions from your health care provider about hydration, which may include:  Up to 2 hours before the procedure - you may continue to drink clear liquids, such as water, clear fruit juice, black coffee, and plain tea.   Eating and drinking restrictions Follow instructions from your health care provider about eating and drinking, which may include:  8 hours before the procedure - stop eating heavy meals or foods, such as meat, fried foods, or fatty foods.  6 hours before the procedure - stop eating light meals or foods, such as toast or cereal.  6 hours before the procedure - stop drinking milk or drinks that contain milk.  2 hours before the procedure - stop drinking clear liquids. Medicines Ask your health care provider about:  Changing or stopping your regular medicines. This is especially important if you are taking diabetes medicines or blood thinners.  Taking medicines such as aspirin and ibuprofen. These medicines can thin your blood. Do not take these medicines unless your health care provider tells you to take them.  Taking over-the-counter medicines, vitamins, herbs, and supplements. General instructions  Do not use any products that contain nicotine  or tobacco for at least 4 weeks before the procedure. These products include cigarettes, e-cigarettes, and chewing tobacco. If you need help quitting, ask your health care provider.  Plan to have someone take you home from the hospital or clinic.  If you will be going home right after the procedure, plan to have someone with you for 24 hours.  You may have an exam or testing. This may include a CT scan or an MRI.  You may have a blood or urine sample taken.  Ask your health care provider: ? How your surgery site will be marked. ? What steps will be taken to help prevent infection. These may include:  Removing hair at the surgery site.  Washing skin with a germ-killing soap.  Taking antibiotic medicine. What happens  during the procedure?  An IV will be inserted into one of your veins.  You will be given one or more of the following: ? A medicine to help you relax (sedative). ? A medicine to make you fall asleep (general anesthetic).  A thin, flexible tube (catheter) will be inserted through your urethra and into your bladder. The catheter will drain urine from your bladder during the procedure and while you heal.  Four or five small incisions will be made in your abdomen.  The laparoscope and other surgical instruments will be put through the incisions. Your surgeon will use the laparoscope and a robotic arm to help control the surgical instruments.  Your urethra will be cut and separated from your bladder, and your prostate and seminal vesicles will be removed.  Your pelvic lymph nodes may also be removed for testing.  Your urethra will be reconnected to your bladder. This will be done so your catheter is still in place inside of your urethra.  A small tube (drain) may be placed in one or more of your incisions to help drain extra fluid from your surgical site.  Your incisions will be closed with stitches (sutures), skin glue, or adhesive strips.  Medicine may be applied to your incisions.  Bandages (dressings) will be placed over your incisions. The procedure may vary among health care providers and hospitals. What happens after the procedure?  Your blood pressure, heart rate, breathing rate, and blood oxygen level will be monitored until you leave the hospital or clinic.  You may continue to receive fluids and medicines through an IV.  You will have some pain. You may receive medicines for pain.  Your catheter will remain in place to drain urine from your bladder.  You may have fluid coming from a drain in any incision.  You may have to wear compression stockings. These stockings help to prevent blood clots and reduce swelling in your legs.  You will be encouraged to move around as  much as you can.  If you were given a sedative during the procedure, it can affect you for several hours. Do not drive or operate machinery until your health care provider says that it is safe. Summary  Robot-assisted laparoscopic prostatectomy is a procedure to remove the prostate and other tissue.  Follow instructions from your health care provider about eating and drinking before your surgery.  You will have a catheter in your bladder after your surgery to drain urine. This information is not intended to replace advice given to you by your health care provider. Make sure you discuss any questions you have with your health care provider. Document Revised: 08/28/2019 Document Reviewed: 08/28/2019 Elsevier Patient Education  2021  [place]: An international perspective (pp. 71-144). [place]: [employer].">  Brachytherapy for Prostate Cancer Brachytherapy for prostate cancer is a type of radiation treatment that involves placing a source of radiation inside the prostate gland. There are several types of brachytherapy:  Low-dose rate (LDR) therapy. This involves temporary or permanent implants of radioactive seeds or pellets that give off a low dose of radiation. ? Temporary low-dose implants are left in the prostate for 1-7 days. The radioactive material is contained within a delivery tool, which may be a needle, a small, thin tube (catheter), or another type of applicator. You will need to stay in the hospital while the delivery tool and radioactive material are in place. ? Permanent low-dose implants are injected into the prostate. These give off radiation for up to 1 year after they are inserted. After the radiation is gone, they remain harmlessly in the body and are not removed.  High-dose rate (HDR) therapy. This involves inserting a material that gives off a higher dose of radiation for only a few minutes. The radioactive material is often wires or ribbons contained  within a delivery tool (needle, applicator, or catheter). The delivery tool is removed after treatment, and no radioactive material is left in the prostate. In brachytherapy, the radiation does not travel far from the prostate, so healthy tissues around the prostate receive only a small dose of radiation. This helps to protect those tissues from injury. In some cases, brachytherapy may be followed by external beam radiation. Tell a health care provider about:  Any allergies you have.  All medicines you are taking, including vitamins, herbs, eye drops, creams, and over-the-counter medicines.  Any problems you or family members have had with anesthetic medicines.  Any surgeries you have had.  Any blood disorders you have.  Any medical conditions you have. What are the risks? Generally, this is a safe procedure. However, problems may occur, including:  Inflammation of the rectum.  Problems with getting or keeping an erection (erectile dysfunction).  Trouble urinating.  Damage to nearby structures or organs.  Diarrhea.  Bleeding.  Inability to control when you urinate or have bowel movements (incontinence). What happens before the procedure? Staying hydrated Follow instructions from your health care provider about hydration, which may include:  Up to 2 hours before the procedure - you may continue to drink clear liquids, such as water, clear fruit juice, black coffee, and plain tea.   Eating and drinking restrictions Follow instructions from your health care provider about eating and drinking, which may include:  8 hours before the procedure - stop eating heavy meals or foods, such as meat, fried foods, or fatty foods.  6 hours before the procedure - stop eating light meals or foods, such as toast or cereal.  6 hours before the procedure - stop drinking milk or drinks that contain milk.  2 hours before the procedure - stop drinking clear liquids. Medicines Ask your health  care provider about:  Changing or stopping your regular medicines. This is especially important if you are taking diabetes medicines or blood thinners.  Taking medicines such as aspirin and ibuprofen. These medicines can thin your blood. Do not take these medicines unless your health care provider tells you to take them.  Taking over-the-counter medicines, vitamins, herbs, and supplements. Tests You may have an exam or testing. This may include:  Imaging tests, such as an ultrasound, a CT scan, or an MRI.  Blood or urine tests.  A  test to check the electrical signals in your heart (electrocardiogram). General instructions  Do not use any products that contain nicotine or tobacco for at least 4 weeks before the procedure. These products include cigarettes, e-cigarettes, and chewing tobacco. If you need help quitting, ask your health care provider.  If you will be going home right after the procedure: ? Plan to have someone take you home from the hospital or clinic. ? Plan to have a responsible adult care for you for at least 24 hours after you leave the hospital or clinic. This is important.  You may need to take medicine to clean out your bowel (bowel prep).  Ask your health care provider: ? How your surgery site will be marked. ? What steps will be taken to help prevent infection. These may include:  Removing hair at the surgery site.  Washing skin with a germ-killing soap.  Taking antibiotic medicine. What happens during the procedure?  An IV will be inserted into one of your veins.  You will be given one or more of the following: ? A medicine to help you relax (sedative). ? A medicine to numb the area (local anesthetic). ? A medicine to make you fall asleep (general anesthetic).  You may have a catheter inserted to drain your bladder.  Your surgeon will insert the radioactive material. The method used will vary depending on whether you are receiving temporary or  permanent brachytherapy. Temporary low-dose or high-dose brachytherapy  A delivery tool (needle, applicator, or catheter) will be inserted into the prostate. It will be inserted through a body cavity, such as the rectum, or through the perineum, which is the area beneath the scrotum.  An X-ray, ultrasound, MRI, or CT scan will be used to guide the delivery tool toward the prostate.  Radioactive seeds, pellets, wires, or ribbons will be fed through the delivery tool.  If the high-dose method is used: ? The radioactive material will be left in for a few minutes and then removed. ? When the treatment is finished, the delivery tool will be removed.  If the low-dose method is used: ? The delivery tool containing the radioactive material will stay in place for 1-7 days. ? You will remain in the hospital while the implant is in place. ? When the treatment is finished, the radioactive material and delivery tool will be removed. Permanent low-dose brachytherapy  A tube or needle will be used to inject small, radioactive seeds or pellets into your prostate.  The needle or tube will be removed, leaving the seeds or pellets in the prostate. The procedure may vary among health care providers and hospitals. What happens after the procedure?  Your blood pressure, heart rate, breathing rate, and blood oxygen level will be monitored until you leave the hospital or clinic.  If you were given a sedative during the procedure, it can affect you for several hours. Do not drive or operate machinery until your health care provider says that it is safe. Summary  Brachytherapy for prostate cancer is a type of radiation treatment that involves placing a source of radiation inside the prostate gland.  There are several types of brachytherapy for prostate cancer: low-dose temporary treatment, low-dose permanent treatment, and high-dose temporary treatment.  The amount of time that the source of radiation is  left in your prostate will depend on the type of brachytherapy you are having. This information is not intended to replace advice given to you by your health care provider. Make sure you  discuss any questions you have with your health care provider. Document Revised: 11/03/2019 Document Reviewed: 08/25/2019 Elsevier Patient Education  [place].

## 2020-12-29 NOTE — Progress Notes (Signed)
   12/29/2020 1:00 PM   Francis Monroe 05-09-1961 [id number]  Reason for visit: Discuss prostate biopsy results  HPI: I saw Francis Monroe in urology clinic to review his prostate biopsy results.  He is a healthy 60 year old male with HIV that is undetectable and no other significant medical problems.  He denies any prior abdominal surgeries.  He is not currently sexually active, but has used Viagra and Cialis for erections in the past with good results.  He denies any urinary symptoms.  He underwent a prostate biopsy on 12/20/2020 for an elevated PSA of 6.3 which showed a 33 g prostate with 7/12 cores positive for prostate adenocarcinoma.  This was primarily 3+4=7 disease, but there was a single core of Gleason score 4+3 =7 with 80% pattern 4 and 44% max core involvement.  We had a lengthy conversation today about the patient's new diagnosis of prostate cancer.  We reviewed the risk classifications per the AUA guidelines including very low risk, low risk, intermediate risk, and high risk disease, and the need for additional staging imaging with CT and bone scan in patients with unfavorable intermediate risk and high risk disease.  I explained that his life expectancy, clinical stage, Gleason score, PSA, and other Monroe-morbidities influence treatment strategies.  We discussed the roles of active surveillance, radiation therapy, surgical therapy with robotic prostatectomy, and hormone therapy with androgen deprivation.  We discussed that patients urinary symptoms also impact treatment strategy, as patients with severe lower urinary tract symptoms may have significant worsening or even develop urinary retention after undergoing radiation.  In regards to surgery, we discussed robotic prostatectomy +/- lymphadenectomy at length.  The procedure takes 3 to 4 hours, and patient's typically discharge home on post-op day #1.  A Foley catheter is left in place for 7 to 10 days to allow for healing of the  vesicourethral anastomosis.  There is a small risk of bleeding, infection, damage to surrounding structures or bowel, hernia, DVT/PE, or serious cardiac or pulmonary complications.  We discussed at length post-op side effects including erectile dysfunction, and the importance of pre-operative erectile function on long-term outcomes.  Even with a nerve sparing approach, there is an approximately 25% rate of permanent erectile dysfunction.  We also discussed postop urinary incontinence at length.  We expect patients to have stress incontinence post-operatively that will improve over period of weeks to months.  Less than 10% of men will require a pad at 1 year after surgery.  Patients will need to avoid heavy lifting and strenuous activity for 3 to 4 weeks, but most men return to their baseline activity status by 6 weeks.  In summary, Francis Monroe is a 60 y.o. 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 He is leaning toward surgery but would like to meet with radiation oncology to discuss the possibility of brachytherapy further which is very reasonable.  CT abdomen pelvis for staging ordered Referral placed to radiation oncology to discuss brachytherapy further Follow-up in 3 to 4 weeks to finalize treatment decision  I spent 45 total minutes on the day of the encounter including pre-visit review of the medical record, face-to-face time with the patient, and post visit ordering of labs/imaging/tests.  Francis Monroe, [facility] [address], [place], [place] [address] [phone]

## 2021-01-03 ENCOUNTER — Institutional Professional Consult (permissible substitution): Payer: BC Managed Care – PPO | Admitting: Radiation Oncology

## 2021-01-04 ENCOUNTER — Encounter: Payer: Self-pay | Admitting: Radiation Oncology

## 2021-01-04 ENCOUNTER — Ambulatory Visit
Admission: RE | Admit: 2021-01-04 | Discharge: 2021-01-04 | Disposition: A | Discharge status: Home / Self Care | Payer: BC Managed Care – PPO | Source: Ambulatory Visit | Attending: Radiation Oncology | Admitting: Radiation Oncology

## 2021-01-04 VITALS — BP 184/114 | HR 86 | Temp 96.8°F | Wt 247.8 lb

## 2021-01-04 DIAGNOSIS — C61 Malignant neoplasm of prostate: Secondary | ICD-10-CM

## 2021-01-04 NOTE — Consult Note (Signed)
NEW PATIENT EVALUATION  Name: Francis Monroe  MRN: [id number]  Date:   01/04/2021     DOB: [birth year]   This 60 y.o. 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 Prostate Cancer    [facility]    DIAGNOSIS: There were no encounter diagnoses.   PREVIOUS INVESTIGATIONS:  CT scan ordered Pathology port reviewed Clinical notes reviewed  HPI: Patient is a 60 year old male presented with an elevated PSA in the 6.3 range.  His prostate exam was fairly unremarkable he had a 3D 30 cc prostate with no evidence of nodularity.  He underwent a transrectal ultrasound-guided biopsy this prompted transrectal ultrasound-guided biopsy showing 7 out of 12 cores positive for acinar type adenocarcinoma mostly Gleason 7 (3+4).  Was 1 core of Gleason 74+3).  Patient is fairly asymptomatic although he does have HIV.  He specifically denies increased lower urinary tract symptoms fatigue.  Patient does have erectile dysfunction uses Cialis for the past 2 to 3 years for that.  He is gone over treatment options including robotic prostatectomy with Dr. Westley Gambles and is now referred to [facility] for opinion.  Patient is scheduled next week for a CT scan of abdomen and pelvis.  PLANNED TREATMENT REGIMEN: Probable robotic prostatectomy  PAST MEDICAL HISTORY:  has a past medical history of Hypertension.    PAST SURGICAL HISTORY:  Past Surgical History:  Procedure Laterality Date  . NO PAST SURGERIES      FAMILY HISTORY: family history includes Healthy in his mother; Stroke in his father.  SOCIAL HISTORY:  reports that he has never smoked. He has never used smokeless tobacco. He reports previous alcohol use. He reports previous drug use.  ALLERGIES: Patient has no known  allergies.  MEDICATIONS:  Current Outpatient Medications  Medication Sig Dispense Refill  . atorvastatin (LIPITOR) 10 MG tablet Take 10 mg by mouth daily.    . cyanocobalamin 1000 MCG tablet Take 2 tablets daily for 2 weeks, then reduce to 1 tablet daily thereafter for Vitamin B12 Deficiency.    Marland Kitchen DOVATO 50-300 MG TABS     . gabapentin (NEURONTIN) 100 MG capsule Take by mouth.    Marland Kitchen lisinopril-hydrochlorothiazide (ZESTORETIC) 20-12.5 MG tablet Take 1 tablet by mouth daily. (Patient not taking: Reported on 01/04/2021)    . losartan (COZAAR) 50 MG tablet Take 100 mg by mouth daily. (Patient not taking: Reported on 01/04/2021)     No current facility-administered medications for this encounter.    ECOG PERFORMANCE STATUS:  0 - Asymptomatic  REVIEW OF SYSTEMS: Patient does have HIV. Patient denies any weight loss, fatigue, weakness, fever, chills or night sweats. Patient denies any loss of vision, blurred vision. Patient denies any ringing  of the ears or hearing loss. No irregular heartbeat. Patient denies heart murmur or history of fainting. Patient denies any chest pain or pain radiating to her upper extremities. Patient denies any shortness of breath, difficulty breathing at night, cough or hemoptysis. Patient denies any swelling in the lower legs. Patient denies any nausea vomiting, vomiting of blood, or coffee ground material in the vomitus. Patient denies any stomach pain. Patient states has had normal bowel movements no significant constipation or diarrhea. Patient denies any dysuria, hematuria or significant nocturia. Patient denies any problems walking, swelling in the joints or loss  of balance. Patient denies any skin changes, loss of hair or loss of weight. Patient denies any excessive worrying or anxiety or significant depression. Patient denies any problems with insomnia. Patient denies excessive thirst, polyuria, polydipsia. Patient denies any swollen glands, patient denies easy bruising or  easy bleeding. Patient denies any recent infections, allergies or URI. Patient "s visual fields have not changed significantly in recent time.   PHYSICAL EXAM: BP (!) 184/114   Pulse 86   Temp (!) 96.8 F (36 C) (Tympanic)   Wt 247 lb 12.8 oz (112.4 kg)   BMI 34.56 kg/m  Well-developed well-nourished patient in NAD. HEENT reveals PERLA, EOMI, discs not visualized.  Oral cavity is clear. No oral mucosal lesions are identified. Neck is clear without evidence of cervical or supraclavicular adenopathy. Lungs are clear to A&P. Cardiac examination is essentially unremarkable with regular rate and rhythm without murmur rub or thrill. Abdomen is benign with no organomegaly or masses noted. Motor sensory and DTR levels are equal and symmetric in the upper and lower extremities. Cranial nerves II through XII are grossly intact. Proprioception is intact. No peripheral adenopathy or edema is identified. No motor or sensory levels are noted. Crude visual fields are within normal range.  LABORATORY DATA: Pathology report reviewed    RADIOLOGY RESULTS: CT scan will be reviewed when available   IMPRESSION: Stage IIa Gleason 7 (3+4) adenocarcinoma the prostate in 60 year old male presenting with a PSA in the 6 range.  PLAN: This, have gone over treatment recommendations including watchful waiting robotic prostatectomy as well as options for radiation therapy.  Risks and benefits of radiation treatment were reviewed with the patient.  My recommendation at this time was for robotic prostatectomy based on his young age.  I explained to him our ability to salvage someone after prostatectomy is excellent using radiation although there is no [place] for treating upfront with radiation with no salvage technique to date.  Patient is leaning toward surgical resection and will have a follow-up appointment with Dr. Jeb Levering next week.  We will also review his CT scan should there be any change in his staging based on that  exam.  Patient comprehends my recommendations well.  I would like to take this opportunity to thank you for allowing me to participate in the care of your patient.Noreene Filbert, MD

## 2021-01-13 ENCOUNTER — Other Ambulatory Visit: Payer: Self-pay

## 2021-01-13 ENCOUNTER — Ambulatory Visit
Admission: RE | Admit: 2021-01-13 | Discharge: 2021-01-13 | Disposition: A | Discharge status: Home / Self Care | Payer: BC Managed Care – PPO | Source: Ambulatory Visit | Attending: Urology | Admitting: Urology

## 2021-01-13 DIAGNOSIS — C61 Malignant neoplasm of prostate: Secondary | ICD-10-CM | POA: Insufficient documentation

## 2021-01-13 LAB — POCT I-STAT CREATININE: Creatinine, Ser: 1.1 mg/dL (ref 0.61–1.24)

## 2021-01-13 MED ORDER — IOHEXOL 300 MG/ML  SOLN
150.0000 mL | Freq: Once | INTRAMUSCULAR | Status: AC | PRN
  Administered 2021-01-13: 125 mL via INTRAVENOUS

## 2021-01-19 ENCOUNTER — Ambulatory Visit (INDEPENDENT_AMBULATORY_CARE_PROVIDER_SITE_OTHER): Payer: BC Managed Care – PPO | Admitting: Urology

## 2021-01-19 ENCOUNTER — Other Ambulatory Visit: Payer: Self-pay

## 2021-01-19 ENCOUNTER — Encounter: Payer: Self-pay | Admitting: Urology

## 2021-01-19 VITALS — BP 182/110 | Ht 71.0 in | Wt 244.0 lb

## 2021-01-19 DIAGNOSIS — C61 Malignant neoplasm of prostate: Secondary | ICD-10-CM

## 2021-01-19 NOTE — Patient Instructions (Signed)
Robot-Assisted Laparoscopic Prostatectomy  Robot-assisted laparoscopic prostatectomy is a minimally invasive procedure to remove the entire prostate, or prostate gland, and the seminal vesicles. The seminal vesicles are near the bladder and the prostate. This procedure is done to treat prostate cancer that has not spread to other parts of the body (has not metastasized). The goals of the procedure are to remove all cancer cells and to prevent prostate cancer from metastasizing. This procedure involves use of a thin, lighted tube with a tiny camera on the end (laparoscope). The laparoscope will allow your surgeon to do the surgery with several small incisions in your abdomen instead of a large incision. Your surgeon will also use robotic arms during the procedure that will be controlled through a computer. During your procedure, the lymph nodes in your pelvis may be removed. Lymph nodes, also called lymph glands, are part of your body's disease-fighting system (immune system). The lymph nodes in the pelvis may be the first place that is affected by the spreading of prostate cancer. If your pelvic lymph nodes are removed, tissue from the nodes will be tested for cancer cells. Tell a health care provider about:  Any allergies you have.  All medicines you are taking, including vitamins, herbs, eye drops, creams, and over-the-counter medicines.  Any problems you or family members have had with anesthetic medicines.  Any blood disorders you have.  Any surgeries you have had.  Any medical conditions you have.  Any prostate infections you have had. What are the risks? Generally, this is a safe procedure. However, problems may occur, including:  Infection.  Bleeding.  Allergic reactions to medicines.  Problems that affect urination or sexual function. These may include: ? Inability to control when you urinate (incontinence). This is usually temporary. ? Narrowing or scarring (stricture) of the  urethra, which is the part that drains urine from your bladder. Stricture may block the flow of urine. ? Inability to get or keep an erection (erectile dysfunction). ? Dry ejaculation. This is when no semen is released during orgasm.  Blockage (obstruction) of the large intestine or small intestine.  Blood clots in the legs.  The formation of a lymphocele. This is a sac (cyst) in the pelvis that is filled with fluid from the lymph nodes.  Damage to nearby structures or organs. What happens before the procedure? Staying hydrated Follow instructions from your health care provider about hydration, which may include:  Up to 2 hours before the procedure - you may continue to drink clear liquids, such as water, clear fruit juice, black coffee, and plain tea.   Eating and drinking restrictions Follow instructions from your health care provider about eating and drinking, which may include:  8 hours before the procedure - stop eating heavy meals or foods, such as meat, fried foods, or fatty foods.  6 hours before the procedure - stop eating light meals or foods, such as toast or cereal.  6 hours before the procedure - stop drinking milk or drinks that contain milk.  2 hours before the procedure - stop drinking clear liquids. Medicines Ask your health care provider about:  Changing or stopping your regular medicines. This is especially important if you are taking diabetes medicines or blood thinners.  Taking medicines such as aspirin and ibuprofen. These medicines can thin your blood. Do not take these medicines unless your health care provider tells you to take them.  Taking over-the-counter medicines, vitamins, herbs, and supplements. General instructions  Do not use any products  that contain nicotine or tobacco for at least 4 weeks before the procedure. These products include cigarettes, e-cigarettes, and chewing tobacco. If you need help quitting, ask your health care provider.  Plan  to have someone take you home from the hospital or clinic.  If you will be going home right after the procedure, plan to have someone with you for 24 hours.  You may have an exam or testing. This may include a CT scan or an MRI.  You may have a blood or urine sample taken.  Ask your health care provider: ? How your surgery site will be marked. ? What steps will be taken to help prevent infection. These may include:  Removing hair at the surgery site.  Washing skin with a germ-killing soap.  Taking antibiotic medicine. What happens during the procedure?  An IV will be inserted into one of your veins.  You will be given one or more of the following: ? A medicine to help you relax (sedative). ? A medicine to make you fall asleep (general anesthetic).  A thin, flexible tube (catheter) will be inserted through your urethra and into your bladder. The catheter will drain urine from your bladder during the procedure and while you heal.  Four or five small incisions will be made in your abdomen.  The laparoscope and other surgical instruments will be put through the incisions. Your surgeon will use the laparoscope and a robotic arm to help control the surgical instruments.  Your urethra will be cut and separated from your bladder, and your prostate and seminal vesicles will be removed.  Your pelvic lymph nodes may also be removed for testing.  Your urethra will be reconnected to your bladder. This will be done so your catheter is still in place inside of your urethra.  A small tube (drain) may be placed in one or more of your incisions to help drain extra fluid from your surgical site.  Your incisions will be closed with stitches (sutures), skin glue, or adhesive strips.  Medicine may be applied to your incisions.  Bandages (dressings) will be placed over your incisions. The procedure may vary among health care providers and hospitals. What happens after the procedure?  Your  blood pressure, heart rate, breathing rate, and blood oxygen level will be monitored until you leave the hospital or clinic.  You may continue to receive fluids and medicines through an IV.  You will have some pain. You may receive medicines for pain.  Your catheter will remain in place to drain urine from your bladder.  You may have fluid coming from a drain in any incision.  You may have to wear compression stockings. These stockings help to prevent blood clots and reduce swelling in your legs.  You will be encouraged to move around as much as you can.  If you were given a sedative during the procedure, it can affect you for several hours. Do not drive or operate machinery until your health care provider says that it is safe. Summary  Robot-assisted laparoscopic prostatectomy is a procedure to remove the prostate and other tissue.  Follow instructions from your health care provider about eating and drinking before your surgery.  You will have a catheter in your bladder after your surgery to drain urine. This information is not intended to replace advice given to you by your health care provider. Make sure you discuss any questions you have with your health care provider. Document Revised: 08/28/2019 Document Reviewed: 08/28/2019 Elsevier Patient  Education  2021 [place] Laparoscopic Prostatectomy, Care After This sheet gives you information about how to care for yourself after your procedure. Your health care provider may also give you more specific instructions. If you have problems or questions, contact your health care provider. What can I expect after the procedure? After the procedure, it is common to have:  Mild pain in your lower abdomen.  Blood in your urine for up to 3 weeks.  A need to urinate more often than usual (bladder spasms). This may last for up to 2 weeks. After your long, thin, flexible tube (catheter) is removed, it is common to  have:  A burning sensation when you urinate. This may last for up to 2 weeks after your catheter is removed.  Trouble controlling when you urinate (incontinence). You may leak urine at times. Your ability to control when you urinate will improve as you continue to heal. Follow these instructions at home: Medicines  Take over-the-counter and prescription medicines only as told by your health care provider. These include any stool softeners.  If you were prescribed an antibiotic medicine, take it as told by your health care provider. Do not stop using the antibiotic even if you start to feel better.  Ask your health care provider if the medicine prescribed to you: ? Requires you to avoid driving or using machinery. ? Can cause constipation. You may need to take these actions to prevent or treat constipation:  Take over-the-counter or prescription medicines.  Eat foods that are high in fiber, such as beans, whole grains, and fresh fruits and vegetables.  Limit foods that are high in fat and processed sugars, such as fried or sweet foods. Eating and drinking  Follow instructions from your health care provider about eating or drinking restrictions.  Drink enough fluid to keep your urine pale yellow. Incision care and drain care  Follow instructions from your health care provider about how to take care of your incisions. Make sure you: ? Wash your hands with soap and water for at least 20 seconds before and after you change your bandages (dressings). If soap and water are not available, use hand sanitizer. ? Change your dressings as told by your health care provider. ? Leave stitches (sutures), skin glue, or adhesive strips in place. These skin closures may need to stay in place for 2 weeks or longer. If adhesive strip edges start to loosen and curl up, you may trim the loose edges. Do not remove adhesive strips completely unless your health care provider tells you to do that.  If you have  a drain in any incision, follow instructions about how to care for your drain. Do not remove the drain tube or any dressing around the tube opening unless your health care provider approves.  Check your incision and drain areas every day for signs of infection. Check for: ? More redness, swelling, or pain. ? Fluid or blood. ? Warmth. ? Pus or a bad smell.   Catheter care  Always wash your hands with soap and water for at least 20 seconds before and after touching your penis, catheter, and drainage bag. If soap and water are not available, use hand sanitizer.  Empty your drainage bag every 3 hours during the day, or as often as told.  Clean the tip of your penis with soap and water two times a day, or as often as told.  If you were given ointment to apply to the tip of  your penis, follow instructions about how to do this.  You will need to visit your health care provider to have your catheter removed. Your catheter may remain in place for up to 3 weeks after your procedure. Managing pain and swelling If directed, put ice on the affected area. To do this:  Put ice in a plastic bag.  Place a towel between your skin and the bag.  Leave the ice on for 20 minutes, 2-3 times a day. Activity  Do not lift anything that is heavier than 10 lb (4.5 kg), or the limit that you are told, until your health care provider says that it is safe.  Avoid intense physical activity for as long as told by your health care provider.  Walk at least once a day. As you start to feel better, you may start to exercise more. Ask your health care provider what exercises are right for you.  Return to your normal activities, including driving, as told by your health care provider. Ask your health care provider what activities are safe for you. General instructions  Do not take baths, swim, or use a hot tub until your health care provider approves. Ask your health care provider if you may take showers. You may only  be allowed to take sponge baths.  If your pelvic lymph nodes were removed for testing, it is up to you to get your test results. Ask your health care provider, or the department that is doing the test, when your results will be ready.  Wear compression stockings as told by your health care provider. These stockings help to prevent blood clots and reduce swelling in your legs.  Follow instructions from your health care provider about when it is safe for you to engage in sexual activity.  Do not strain to have a bowel movement.  Do not use any products that contain nicotine or tobacco, such as cigarettes, e-cigarettes, and chewing tobacco. These can delay healing after surgery. If you need help quitting, ask your health care provider.  Keep all follow-up visits as told by your health care provider. This is important. Contact a health care provider if:  You have problems with your catheter.  You have bladder spasms more than 3 or 4 times a day.  You become constipated. You may have: ? Trouble having a bowel movement. ? Stools that are hard, dry, or larger than normal.  You have any of these signs of infection around any incision area, any drain area, or your insertion site: ? More redness, swelling, or pain. ? Fluid or blood. ? Warmth. ? Pus or a bad smell.  You have a fever.  You have pain that gets worse. Get help right away if:  Your catheter falls out.  You cannot urinate or pass urine through your catheter.  You have more bright red blood in your urine 3 weeks or more after your procedure.  You have blood clots in your urine.  You develop: ? Chest pains. ? Shortness of breath. ? Swelling or pain in your legs. These symptoms may represent a serious problem that is an emergency. Do not wait to see if the symptoms will go away. Get medical help right away. Call your local emergency services (911 in the [place] Do not drive yourself to the hospital. Summary  After your  procedure, it is common to have mild pain, blood in your urine for up to 3 weeks, and a need to urinate more often.  Follow instructions  from your health care provider about how to take care of your incisions.  If your pelvic lymph nodes were removed for testing, it is up to you to get your test results. Ask your health care provider, or the department that is doing the test, when your results will be ready.  Keep all follow-up visits as told by your health care provider. This is important. This information is not intended to replace advice given to you by your health care provider. Make sure you discuss any questions you have with your health care provider. Document Revised: 08/28/2019 Document Reviewed: 08/28/2019 Elsevier Patient Education  2021 [employer].

## 2021-01-19 NOTE — Progress Notes (Signed)
   01/19/2021 10:12 AM   Francis Monroe [id number] [id number]  Reason for visit: Follow up prostate cancer  HPI: 60 year old male with HIV that is undetectable and otherwise very healthy who was recently found to have an elevated PSA of 6.3 and prostate biopsy on 12/20/2020 showed a 33 g prostate with 7/12 cores positive for prostate adenocarcinoma.  This was primarily 3+4=7 disease, but there was a single core of Gleason score 4+3 =7 with 80% pattern 4 and 44% max core involvement.  I personally viewed and interpreted the staging imaging with CT scan completed on 01/13/2021 which shows no evidence of metastatic disease, prostate measures 30 g.  He previously met with Dr. Baruch Gouty and radiation oncology, and he prefers surgery.  I think this is very reasonable with his intermediate risk disease and very young age. In regards to surgery, we discussed robotic prostatectomy +/- lymphadenectomy at length.  The procedure takes 3 to 4 hours, and patient's typically discharge home on post-op day #1.  A Foley catheter is left in place for 7 to 10 days to allow for healing of the vesicourethral anastomosis.  There is a small risk of bleeding, infection, damage to surrounding structures or bowel, hernia, DVT/PE, or serious cardiac or pulmonary complications.  We discussed at length post-op side effects including erectile dysfunction, and the importance of pre-operative erectile function on long-term outcomes.  Even with a nerve sparing approach, there is an approximately 25% rate of permanent erectile dysfunction.  We also discussed postop urinary incontinence at length.  We expect patients to have stress incontinence post-operatively that will improve over period of weeks to months.  Less than 10% of men will require a pad at 1 year after surgery.  Patients will need to avoid heavy lifting and strenuous activity for 3 to 4 weeks, but most men return to their baseline activity status by 6 weeks.  Schedule robotic  radical prostatectomy and bilateral pelvic lymph node dissection.  Patient prefers June or July based on his work schedule   [name], [place] [address] [place], [place] [address] [phone]

## 2021-02-09 DIAGNOSIS — L404 Guttate psoriasis: Secondary | ICD-10-CM | POA: Insufficient documentation

## 2021-02-13 NOTE — Addendum Note (Signed)
Encounter addended by: Annie Paras on: 02/13/2021 12:12 PM  Actions taken: Letter saved

## 2021-04-06 ENCOUNTER — Other Ambulatory Visit: Payer: Self-pay | Admitting: Urology

## 2021-04-06 DIAGNOSIS — C61 Malignant neoplasm of prostate: Secondary | ICD-10-CM

## 2021-04-14 ENCOUNTER — Other Ambulatory Visit: Payer: BC Managed Care – PPO

## 2021-04-18 ENCOUNTER — Other Ambulatory Visit
Admission: RE | Admit: 2021-04-18 | Discharge: 2021-04-18 | Disposition: A | Discharge status: Home / Self Care | Payer: BC Managed Care – PPO | Source: Ambulatory Visit | Attending: Urology | Admitting: Urology

## 2021-04-18 ENCOUNTER — Other Ambulatory Visit: Payer: Self-pay | Admitting: *Deleted

## 2021-04-18 ENCOUNTER — Other Ambulatory Visit: Payer: Self-pay

## 2021-04-18 DIAGNOSIS — Z01812 Encounter for preprocedural laboratory examination: Secondary | ICD-10-CM | POA: Diagnosis not present

## 2021-04-18 DIAGNOSIS — R972 Elevated prostate specific antigen [PSA]: Secondary | ICD-10-CM

## 2021-04-18 LAB — BASIC METABOLIC PANEL
Anion gap: 6 (ref 5–15)
BUN: 9 mg/dL (ref 8–23)
CO2: 27 mmol/L (ref 22–32)
Calcium: 8.7 mg/dL — ABNORMAL LOW (ref 8.9–10.3)
Chloride: 105 mmol/L (ref 98–111)
Creatinine, Ser: 1 mg/dL (ref 0.61–1.24)
GFR, Estimated: >60 mL/min (ref >60)
Glucose, Bld: 105 mg/dL — ABNORMAL HIGH (ref 70–99)
Potassium: 3.7 mmol/L (ref 3.5–5.1)
Sodium: 138 mmol/L (ref 135–145)

## 2021-04-18 LAB — TYPE AND SCREEN
ABO/RH(D): O POS
Antibody Screen: NEGATIVE

## 2021-04-18 LAB — URINALYSIS, ROUTINE W REFLEX MICROSCOPIC
Bilirubin Urine: NEGATIVE
Glucose, UA: NEGATIVE mg/dL
Hgb urine dipstick: NEGATIVE
Ketones, ur: NEGATIVE mg/dL
Leukocytes,Ua: NEGATIVE
Nitrite: NEGATIVE
Protein, ur: NEGATIVE mg/dL
Specific Gravity, Urine: 1.011 (ref 1.005–1.030)
pH: 7 (ref 5.0–8.0)

## 2021-04-18 LAB — PROTIME-INR
INR: 1 (ref 0.8–1.2)
Prothrombin Time: 13.5 seconds (ref 11.4–15.2)

## 2021-04-18 LAB — CBC
HCT: 42.3 % (ref 39.0–52.0)
Hemoglobin: 14.4 g/dL (ref 13.0–17.0)
MCH: 29.9 pg (ref 26.0–34.0)
MCHC: 34 g/dL (ref 30.0–36.0)
MCV: 87.9 fL (ref 80.0–100.0)
Platelets: 226 10*3/uL (ref 150–400)
RBC: 4.81 MIL/uL (ref 4.22–5.81)
RDW: 13.9 % (ref 11.5–15.5)
WBC: 6.9 10*3/uL (ref 4.0–10.5)
nRBC: 0 % (ref 0.0–0.2)

## 2021-04-19 ENCOUNTER — Other Ambulatory Visit: Payer: Self-pay

## 2021-04-19 ENCOUNTER — Other Ambulatory Visit
Admission: RE | Admit: 2021-04-19 | Discharge: 2021-04-19 | Disposition: A | Discharge status: Home / Self Care | Payer: BC Managed Care – PPO | Source: Ambulatory Visit | Attending: Urology | Admitting: Urology

## 2021-04-19 HISTORY — DX: Unspecified osteoarthritis, unspecified site: M19.90

## 2021-04-19 LAB — URINE CULTURE: Culture: NO GROWTH

## 2021-04-19 NOTE — Patient Instructions (Addendum)
Your procedure is scheduled on: 04/25/21 - Monday Report to the Registration Desk on the 1st floor of the [facility]. To find out your arrival time, please call [phone] between 1PM - 3PM on: 04/22/21 - Friday Report to [facility] for Covid Test 04/22/21 at 1105 am.  REMEMBER: Instructions that are not followed completely may result in serious medical risk, up to and including death; or upon the discretion of your surgeon and anesthesiologist your surgery may need to be rescheduled.  Do not eat food or drink any fluids after midnight the night before surgery.  No gum chewing, lozengers or hard candies.  TAKE THESE MEDICATIONS THE MORNING OF SURGERY WITH A SIP OF Water: NONE  One week prior to surgery: Stop Anti-inflammatories (NSAIDS) such as Advil, Aleve, Ibuprofen, Motrin, Naproxen, Naprosyn and Aspirin based products such as Excedrin, Goodys Powder, BC Powder.  Stop ANY OVER THE COUNTER supplements until after surgery.  You may take Tylenol if needed for pain up until the day of surgery.  No Alcohol for 24 hours before or after surgery.  No Smoking including e-cigarettes for 24 hours prior to surgery.  No chewable tobacco products for at least 6 hours prior to surgery.  No nicotine patches on the day of surgery.  Do not use any "recreational" drugs for at least a week prior to your surgery.  Please be advised that the combination of cocaine and anesthesia may have negative outcomes, up to and including death. If you test positive for cocaine, your surgery will be cancelled.  On the morning of surgery brush your teeth with toothpaste and water, you may rinse your mouth with mouthwash if you wish. Do not swallow any toothpaste or mouthwash.  Do not wear jewelry, make-up, hairpins, clips or nail polish.  Do not wear lotions, powders, or perfumes.   Do not shave body from the neck down 48 hours prior to surgery just in case you cut yourself which could leave a site for  infection.  Also, freshly shaved skin may become irritated if using the CHG soap.  Contact lenses, hearing aids and dentures may not be worn into surgery.  Do not bring valuables to the hospital. [facility] is not responsible for any missing/lost belongings or valuables.   Notify your doctor if there is any change in your medical condition (cold, fever, infection).  Wear comfortable clothing (specific to your surgery type) to the hospital.  After surgery, you can help prevent lung complications by doing breathing exercises.  Take deep breaths and cough every 1-2 hours. Your doctor may order a device called an Incentive Spirometer to help you take deep breaths. When coughing or sneezing, hold a pillow firmly against your incision with both hands. This is called "splinting." Doing this helps protect your incision. It also decreases belly discomfort.  If you are being admitted to the hospital overnight, leave your suitcase in the car. After surgery it may be brought to your room.  If you are being discharged the day of surgery, you will not be allowed to drive home. You will need a responsible adult (18 years or older) to drive you home and stay with you that night.   If you are taking public transportation, you will need to have a responsible adult (18 years or older) with you. Please confirm with your physician that it is acceptable to use public transportation.   Please call the [facility] at [phone] if you have any questions about these  instructions.  Surgery Visitation Policy:  Patients undergoing a surgery or procedure may have one family member or support person with them as long as that person is not COVID-19 positive or experiencing its symptoms.  That person may remain in the waiting area during the procedure.  Inpatient Visitation:    Visiting hours are 7 a.m. to 8 p.m. Inpatients will be allowed two visitors daily. The visitors may change each day  during the patient's stay. No visitors under the age of 44. Any visitor under the age of 5 must be accompanied by an adult. The visitor must pass COVID-19 screenings, use hand sanitizer when entering and exiting the patient's room and wear a mask at all times, including in the patient's room. Patients must also wear a mask when staff or their visitor are in the room. Masking is required regardless of vaccination status.

## 2021-04-22 ENCOUNTER — Other Ambulatory Visit
Admission: RE | Admit: 2021-04-22 | Discharge: 2021-04-22 | Disposition: A | Discharge status: Home / Self Care | Payer: BC Managed Care – PPO | Source: Ambulatory Visit | Attending: Urology | Admitting: Urology

## 2021-04-22 ENCOUNTER — Other Ambulatory Visit: Payer: Self-pay

## 2021-04-22 DIAGNOSIS — Z01812 Encounter for preprocedural laboratory examination: Secondary | ICD-10-CM | POA: Insufficient documentation

## 2021-04-22 DIAGNOSIS — Z20822 Contact with and (suspected) exposure to covid-19: Secondary | ICD-10-CM | POA: Insufficient documentation

## 2021-04-22 LAB — SARS CORONAVIRUS 2 (TAT 6-24 HRS): SARS Coronavirus 2: NEGATIVE

## 2021-04-25 ENCOUNTER — Encounter: Payer: Self-pay | Admitting: Urology

## 2021-04-25 ENCOUNTER — Encounter
Admission: RE | Disposition: A | Discharge status: Home / Self Care | Payer: Self-pay | Source: Home / Self Care | Attending: Urology

## 2021-04-25 ENCOUNTER — Other Ambulatory Visit: Payer: Self-pay

## 2021-04-25 ENCOUNTER — Observation Stay
Admission: RE | Admit: 2021-04-25 | Discharge: 2021-04-27 | Disposition: A | Discharge status: Home / Self Care | Payer: BC Managed Care – PPO | Attending: Urology | Admitting: Urology

## 2021-04-25 ENCOUNTER — Ambulatory Visit: Payer: BC Managed Care – PPO | Admitting: Urgent Care

## 2021-04-25 DIAGNOSIS — E02 Subclinical iodine-deficiency hypothyroidism: Secondary | ICD-10-CM | POA: Insufficient documentation

## 2021-04-25 DIAGNOSIS — R7303 Prediabetes: Secondary | ICD-10-CM | POA: Diagnosis not present

## 2021-04-25 DIAGNOSIS — C61 Malignant neoplasm of prostate: Secondary | ICD-10-CM

## 2021-04-25 DIAGNOSIS — I10 Essential (primary) hypertension: Secondary | ICD-10-CM | POA: Insufficient documentation

## 2021-04-25 HISTORY — PX: ROBOT ASSISTED LAPAROSCOPIC RADICAL PROSTATECTOMY: SHX5141

## 2021-04-25 HISTORY — PX: LYMPH NODE DISSECTION: SHX5087

## 2021-04-25 LAB — CBC
HCT: 41 % (ref 39.0–52.0)
Hemoglobin: 13.8 g/dL (ref 13.0–17.0)
MCH: 30.3 pg (ref 26.0–34.0)
MCHC: 33.7 g/dL (ref 30.0–36.0)
MCV: 90.1 fL (ref 80.0–100.0)
Platelets: 251 10*3/uL (ref 150–400)
RBC: 4.55 MIL/uL (ref 4.22–5.81)
RDW: 14.1 % (ref 11.5–15.5)
WBC: 12.8 10*3/uL — ABNORMAL HIGH (ref 4.0–10.5)
nRBC: 0 % (ref 0.0–0.2)

## 2021-04-25 LAB — ABO/RH: ABO/RH(D): O POS

## 2021-04-25 SURGERY — PROSTATECTOMY, RADICAL, ROBOT-ASSISTED, LAPAROSCOPIC
Anesthesia: General

## 2021-04-25 MED ORDER — SODIUM CHLORIDE 0.9 % IV BOLUS
1000.0000 mL | Freq: Once | INTRAVENOUS | Status: AC
  Administered 2021-04-25: 1000 mL via INTRAVENOUS

## 2021-04-25 MED ORDER — LIDOCAINE HCL (PF) 2 % IJ SOLN
INTRAMUSCULAR | Status: AC
  Filled 2021-04-25: qty 5

## 2021-04-25 MED ORDER — CHLORHEXIDINE GLUCONATE 0.12 % MT SOLN
OROMUCOSAL | Status: AC
  Administered 2021-04-25: 15 mL via OROMUCOSAL
  Filled 2021-04-25: qty 15

## 2021-04-25 MED ORDER — PROPOFOL 10 MG/ML IV BOLUS
INTRAVENOUS | Status: DC | PRN
  Administered 2021-04-25: 200 mg via INTRAVENOUS

## 2021-04-25 MED ORDER — CHLORHEXIDINE GLUCONATE 0.12 % MT SOLN: 15.0000 mL | Freq: Once | OROMUCOSAL | Status: AC

## 2021-04-25 MED ORDER — DOLUTEGRAVIR-LAMIVUDINE 50-300 MG PO TABS: 1.0000 | ORAL_TABLET | Freq: Every day | ORAL | Status: DC

## 2021-04-25 MED ORDER — ORAL CARE MOUTH RINSE: 15.0000 mL | Freq: Once | OROMUCOSAL | Status: AC

## 2021-04-25 MED ORDER — MEPERIDINE HCL 25 MG/ML IJ SOLN: 6.2500 mg | INTRAMUSCULAR | Status: DC | PRN

## 2021-04-25 MED ORDER — FENTANYL CITRATE (PF) 100 MCG/2ML IJ SOLN
INTRAMUSCULAR | Status: AC
  Administered 2021-04-25: 25 ug via INTRAVENOUS
  Filled 2021-04-25: qty 2

## 2021-04-25 MED ORDER — THROMBIN 5000 UNITS EX SOLR
CUTANEOUS | Status: DC | PRN
  Administered 2021-04-25: 5000 [IU] via TOPICAL

## 2021-04-25 MED ORDER — FENTANYL CITRATE (PF) 250 MCG/5ML IJ SOLN
INTRAMUSCULAR | Status: AC
  Filled 2021-04-25: qty 5

## 2021-04-25 MED ORDER — PROPOFOL 500 MG/50ML IV EMUL
INTRAVENOUS | Status: AC
  Filled 2021-04-25: qty 50

## 2021-04-25 MED ORDER — ONDANSETRON HCL 4 MG/2ML IJ SOLN
INTRAMUSCULAR | Status: DC | PRN
  Administered 2021-04-25: 4 mg via INTRAVENOUS

## 2021-04-25 MED ORDER — HYDROCODONE-ACETAMINOPHEN 5-325 MG PO TABS: 1.0000 | ORAL_TABLET | Freq: Four times a day (QID) | ORAL | 0 refills | Status: DC | PRN

## 2021-04-25 MED ORDER — SUGAMMADEX SODIUM 500 MG/5ML IV SOLN
INTRAVENOUS | Status: DC | PRN
  Administered 2021-04-25: 400 mg via INTRAVENOUS

## 2021-04-25 MED ORDER — OXYBUTYNIN CHLORIDE 5 MG PO TABS
5.0000 mg | ORAL_TABLET | Freq: Three times a day (TID) | ORAL | Status: DC | PRN
  Administered 2021-04-26: 5 mg via ORAL
  Filled 2021-04-25: qty 1

## 2021-04-25 MED ORDER — VITAMIN B-12 2500 MCG SL SUBL: 2500.0000 ug | SUBLINGUAL_TABLET | Freq: Every day | SUBLINGUAL | Status: DC

## 2021-04-25 MED ORDER — MIDAZOLAM HCL 2 MG/2ML IJ SOLN
INTRAMUSCULAR | Status: AC
  Filled 2021-04-25: qty 2

## 2021-04-25 MED ORDER — FENTANYL CITRATE (PF) 100 MCG/2ML IJ SOLN
INTRAMUSCULAR | Status: DC | PRN
  Administered 2021-04-25 (×2): 100 ug via INTRAVENOUS
  Administered 2021-04-25: 50 ug via INTRAVENOUS

## 2021-04-25 MED ORDER — AMLODIPINE-ATORVASTATIN 5-10 MG PO TABS: 1.0000 | ORAL_TABLET | Freq: Every day | ORAL | Status: DC

## 2021-04-25 MED ORDER — CEFAZOLIN SODIUM-DEXTROSE 2-4 GM/100ML-% IV SOLN
2.0000 g | INTRAVENOUS | Status: AC
Start: 2021-04-25 — End: 2021-04-25
  Administered 2021-04-25: 2 g via INTRAVENOUS

## 2021-04-25 MED ORDER — HEMOSTATIC AGENTS (NO CHARGE) OPTIME
TOPICAL | Status: DC | PRN
  Administered 2021-04-25: 1 via TOPICAL

## 2021-04-25 MED ORDER — SUCCINYLCHOLINE CHLORIDE 200 MG/10ML IV SOSY
PREFILLED_SYRINGE | INTRAVENOUS | Status: AC
  Filled 2021-04-25: qty 10

## 2021-04-25 MED ORDER — LACTATED RINGERS IV SOLN: INTRAVENOUS | Status: DC

## 2021-04-25 MED ORDER — ACETAMINOPHEN 10 MG/ML IV SOLN
INTRAVENOUS | Status: AC
  Filled 2021-04-25: qty 100

## 2021-04-25 MED ORDER — ONDANSETRON HCL 4 MG/2ML IJ SOLN: 4.0000 mg | INTRAMUSCULAR | Status: DC | PRN

## 2021-04-25 MED ORDER — PROMETHAZINE HCL 25 MG/ML IJ SOLN: 6.2500 mg | INTRAMUSCULAR | Status: DC | PRN

## 2021-04-25 MED ORDER — LAMIVUDINE 150 MG PO TABS
300.0000 mg | ORAL_TABLET | Freq: Every day | ORAL | Status: DC
  Administered 2021-04-25 – 2021-04-26 (×2): 300 mg via ORAL
  Filled 2021-04-25 (×3): qty 2

## 2021-04-25 MED ORDER — HEPARIN SODIUM (PORCINE) 5000 UNIT/ML IJ SOLN
5000.0000 [IU] | Freq: Three times a day (TID) | INTRAMUSCULAR | Status: DC
  Administered 2021-04-25 – 2021-04-27 (×5): 5000 [IU] via SUBCUTANEOUS
  Filled 2021-04-25 (×5): qty 1

## 2021-04-25 MED ORDER — AMLODIPINE BESYLATE 5 MG PO TABS
5.0000 mg | ORAL_TABLET | Freq: Every day | ORAL | Status: DC
  Administered 2021-04-25 – 2021-04-26 (×2): 5 mg via ORAL
  Filled 2021-04-25 (×2): qty 1

## 2021-04-25 MED ORDER — SODIUM CHLORIDE 0.9 % IV SOLN
INTRAVENOUS | Status: DC | PRN
  Administered 2021-04-25: 30 ug/min via INTRAVENOUS

## 2021-04-25 MED ORDER — BUPIVACAINE HCL (PF) 0.5 % IJ SOLN
INTRAMUSCULAR | Status: AC
  Filled 2021-04-25: qty 30

## 2021-04-25 MED ORDER — BUPIVACAINE HCL 0.5 % IJ SOLN
INTRAMUSCULAR | Status: DC | PRN
  Administered 2021-04-25: 15 mL

## 2021-04-25 MED ORDER — HYDROCODONE-ACETAMINOPHEN 5-325 MG PO TABS
1.0000 | ORAL_TABLET | ORAL | Status: DC | PRN
  Administered 2021-04-26: 2 via ORAL
  Filled 2021-04-25: qty 2

## 2021-04-25 MED ORDER — THROMBIN 5000 UNITS EX SOLR
CUTANEOUS | Status: AC
  Filled 2021-04-25: qty 5000

## 2021-04-25 MED ORDER — ROCURONIUM BROMIDE 10 MG/ML (PF) SYRINGE
PREFILLED_SYRINGE | INTRAVENOUS | Status: AC
  Filled 2021-04-25: qty 10

## 2021-04-25 MED ORDER — DOCUSATE SODIUM 100 MG PO CAPS: 100.0000 mg | ORAL_CAPSULE | Freq: Two times a day (BID) | ORAL | 0 refills | Status: DC

## 2021-04-25 MED ORDER — DOCUSATE SODIUM 100 MG PO CAPS
100.0000 mg | ORAL_CAPSULE | Freq: Two times a day (BID) | ORAL | Status: DC
  Administered 2021-04-25 – 2021-04-27 (×4): 100 mg via ORAL
  Filled 2021-04-25 (×4): qty 1

## 2021-04-25 MED ORDER — DEXMEDETOMIDINE (PRECEDEX) IN NS 20 MCG/5ML (4 MCG/ML) IV SYRINGE
PREFILLED_SYRINGE | INTRAVENOUS | Status: DC | PRN
  Administered 2021-04-25: 12 ug via INTRAVENOUS
  Administered 2021-04-25: 8 ug via INTRAVENOUS

## 2021-04-25 MED ORDER — OXYCODONE HCL 5 MG PO TABS
ORAL_TABLET | ORAL | Status: AC
  Filled 2021-04-25: qty 1

## 2021-04-25 MED ORDER — PHENYLEPHRINE HCL (PRESSORS) 10 MG/ML IV SOLN
INTRAVENOUS | Status: DC | PRN
  Administered 2021-04-25 (×2): 100 ug via INTRAVENOUS
  Administered 2021-04-25: 200 ug via INTRAVENOUS

## 2021-04-25 MED ORDER — FENTANYL CITRATE (PF) 100 MCG/2ML IJ SOLN
25.0000 ug | INTRAMUSCULAR | Status: DC | PRN
  Administered 2021-04-25 (×3): 25 ug via INTRAVENOUS

## 2021-04-25 MED ORDER — OXYCODONE HCL 5 MG PO TABS
5.0000 mg | ORAL_TABLET | Freq: Once | ORAL | Status: AC | PRN
  Administered 2021-04-25: 5 mg via ORAL

## 2021-04-25 MED ORDER — GLYCOPYRROLATE 0.2 MG/ML IJ SOLN
INTRAMUSCULAR | Status: DC | PRN
  Administered 2021-04-25: .2 mg via INTRAVENOUS

## 2021-04-25 MED ORDER — HYDROMORPHONE HCL 1 MG/ML IJ SOLN
0.5000 mg | INTRAMUSCULAR | Status: DC | PRN
  Administered 2021-04-25 – 2021-04-26 (×5): 1 mg via INTRAVENOUS
  Filled 2021-04-25 (×6): qty 1

## 2021-04-25 MED ORDER — MIDAZOLAM HCL 2 MG/2ML IJ SOLN
INTRAMUSCULAR | Status: DC | PRN
  Administered 2021-04-25: 2 mg via INTRAVENOUS

## 2021-04-25 MED ORDER — VITAMIN B-12 1000 MCG PO TABS
2500.0000 ug | ORAL_TABLET | Freq: Every day | ORAL | Status: DC
  Administered 2021-04-26 – 2021-04-27 (×2): 2500 ug via ORAL
  Filled 2021-04-25 (×2): qty 3

## 2021-04-25 MED ORDER — ATORVASTATIN CALCIUM 10 MG PO TABS
10.0000 mg | ORAL_TABLET | Freq: Every day | ORAL | Status: DC
  Administered 2021-04-25 – 2021-04-26 (×2): 10 mg via ORAL
  Filled 2021-04-25 (×2): qty 1

## 2021-04-25 MED ORDER — ACETAMINOPHEN 10 MG/ML IV SOLN
INTRAVENOUS | Status: DC | PRN
  Administered 2021-04-25: 1000 mg via INTRAVENOUS

## 2021-04-25 MED ORDER — ROCURONIUM BROMIDE 100 MG/10ML IV SOLN
INTRAVENOUS | Status: DC | PRN
  Administered 2021-04-25: 20 mg via INTRAVENOUS
  Administered 2021-04-25: 50 mg via INTRAVENOUS
  Administered 2021-04-25: 20 mg via INTRAVENOUS
  Administered 2021-04-25 (×2): 30 mg via INTRAVENOUS

## 2021-04-25 MED ORDER — LIDOCAINE HCL (CARDIAC) PF 100 MG/5ML IV SOSY
PREFILLED_SYRINGE | INTRAVENOUS | Status: DC | PRN
  Administered 2021-04-25: 100 mg via INTRAVENOUS

## 2021-04-25 MED ORDER — OXYCODONE HCL 5 MG/5ML PO SOLN: 5.0000 mg | Freq: Once | ORAL | Status: AC | PRN

## 2021-04-25 MED ORDER — ACETAMINOPHEN 325 MG PO TABS
650.0000 mg | ORAL_TABLET | ORAL | Status: DC | PRN
  Administered 2021-04-26: 650 mg via ORAL
  Filled 2021-04-25: qty 2

## 2021-04-25 MED ORDER — CEFAZOLIN SODIUM-DEXTROSE 2-4 GM/100ML-% IV SOLN
INTRAVENOUS | Status: AC
  Filled 2021-04-25: qty 100

## 2021-04-25 MED ORDER — HEPARIN SODIUM (PORCINE) 5000 UNIT/ML IJ SOLN: 5000.0000 [IU] | INTRAMUSCULAR | Status: AC

## 2021-04-25 MED ORDER — DOLUTEGRAVIR SODIUM 50 MG PO TABS
50.0000 mg | ORAL_TABLET | Freq: Every day | ORAL | Status: DC
  Administered 2021-04-25 – 2021-04-26 (×2): 50 mg via ORAL
  Filled 2021-04-25 (×3): qty 1

## 2021-04-25 MED ORDER — HEPARIN SODIUM (PORCINE) 5000 UNIT/ML IJ SOLN
INTRAMUSCULAR | Status: AC
  Administered 2021-04-25: 5000 [IU] via SUBCUTANEOUS
  Filled 2021-04-25: qty 1

## 2021-04-25 MED ORDER — FENTANYL CITRATE (PF) 100 MCG/2ML IJ SOLN
INTRAMUSCULAR | Status: AC
  Administered 2021-04-25: 50 ug via INTRAVENOUS
  Filled 2021-04-25: qty 2

## 2021-04-25 MED ORDER — DEXAMETHASONE SODIUM PHOSPHATE 10 MG/ML IJ SOLN
INTRAMUSCULAR | Status: DC | PRN
  Administered 2021-04-25: 10 mg via INTRAVENOUS

## 2021-04-25 MED ORDER — EPHEDRINE SULFATE 50 MG/ML IJ SOLN
INTRAMUSCULAR | Status: DC | PRN
  Administered 2021-04-25 (×2): 5 mg via INTRAVENOUS

## 2021-04-25 SURGICAL SUPPLY — 86 items
ANCHOR TIS RET SYS 235ML (MISCELLANEOUS) ×3 IMPLANT
APPLICATOR SURGIFLO ENDO (HEMOSTASIS) ×3 IMPLANT
BAG URINE DRAIN 2000ML AR STRL (UROLOGICAL SUPPLIES) ×3 IMPLANT
BLADE CLIPPER SURG (BLADE) ×3 IMPLANT
BULB RESERV EVAC DRAIN JP 100C (MISCELLANEOUS) IMPLANT
CANISTER SUCT 1200ML W/VALVE (MISCELLANEOUS) ×3 IMPLANT
CATH FOL 2WAY LX 18X30 (CATHETERS) ×3 IMPLANT
CATH FOL 2WAY LX 18X5 (CATHETERS) IMPLANT
CATH FOL 2WAY LX 20X5 (CATHETERS) ×3 IMPLANT
CHLORAPREP W/TINT 26 (MISCELLANEOUS) ×6 IMPLANT
CLIP VESOLOCK LG 6/CT PURPLE (CLIP) ×9 IMPLANT
COVER TIP SHEARS 8 DVNC (MISCELLANEOUS) ×1 IMPLANT
COVER TIP SHEARS 8MM DA VINCI (MISCELLANEOUS) ×2
COVER WAND RF STERILE (DRAPES) ×3 IMPLANT
DEFOGGER SCOPE WARMER CLEARIFY (MISCELLANEOUS) ×3 IMPLANT
DERMABOND ADVANCED (GAUZE/BANDAGES/DRESSINGS) ×2
DERMABOND ADVANCED .7 DNX12 (GAUZE/BANDAGES/DRESSINGS) ×1 IMPLANT
DRAIN CHANNEL JP 15F RND 16 (MISCELLANEOUS) IMPLANT
DRAIN CHANNEL JP 19F (MISCELLANEOUS) IMPLANT
DRAPE 3/4 80X56 (DRAPES) ×3 IMPLANT
DRAPE ARM DVNC X/XI (DISPOSABLE) ×4 IMPLANT
DRAPE COLUMN DVNC XI (DISPOSABLE) ×1 IMPLANT
DRAPE DA VINCI XI ARM (DISPOSABLE) ×8
DRAPE DA VINCI XI COLUMN (DISPOSABLE) ×2
DRAPE LEGGINS SURG 28X43 STRL (DRAPES) ×3 IMPLANT
DRAPE SURG 17X11 SM STRL (DRAPES) ×12 IMPLANT
DRAPE UNDER BUTTOCK W/FLU (DRAPES) ×3 IMPLANT
DRSG TELFA 3X8 NADH (GAUZE/BANDAGES/DRESSINGS) ×3 IMPLANT
ELECT REM PT RETURN 9FT ADLT (ELECTROSURGICAL) ×3
ELECTRODE REM PT RTRN 9FT ADLT (ELECTROSURGICAL) ×1 IMPLANT
GLOVE SURG UNDER POLY LF SZ7.5 (GLOVE) ×9 IMPLANT
GOWN STRL REUS W/ TWL LRG LVL3 (GOWN DISPOSABLE) ×2 IMPLANT
GOWN STRL REUS W/ TWL XL LVL3 (GOWN DISPOSABLE) ×2 IMPLANT
GOWN STRL REUS W/TWL LRG LVL3 (GOWN DISPOSABLE) ×4
GOWN STRL REUS W/TWL XL LVL3 (GOWN DISPOSABLE) ×4
GRASPER SUT TROCAR 14GX15 (MISCELLANEOUS) ×3 IMPLANT
HEMOSTAT SURGICEL 2X14 (HEMOSTASIS) ×3 IMPLANT
HOLDER FOLEY CATH W/STRAP (MISCELLANEOUS) ×3 IMPLANT
IRRIGATION STRYKERFLOW (MISCELLANEOUS) ×1 IMPLANT
IRRIGATOR STRYKERFLOW (MISCELLANEOUS) ×3
IV LACTATED RINGERS 1000ML (IV SOLUTION) ×3 IMPLANT
KIT PINK PAD W/HEAD ARE REST (MISCELLANEOUS) ×3
KIT PINK PAD W/HEAD ARM REST (MISCELLANEOUS) ×1 IMPLANT
LABEL OR SOLS (LABEL) ×3 IMPLANT
MANIFOLD NEPTUNE II (INSTRUMENTS) ×3 IMPLANT
NEEDLE HYPO 22GX1.5 SAFETY (NEEDLE) ×3 IMPLANT
NEEDLE INSUFFLATION 14GA 120MM (NEEDLE) ×3 IMPLANT
NEEDLE INSUFFLATION 150MM (ENDOMECHANICALS) ×3 IMPLANT
NS IRRIG 500ML POUR BTL (IV SOLUTION) ×3 IMPLANT
OBTURATOR OPTICAL STANDARD 8MM (TROCAR) ×2
OBTURATOR OPTICAL STND 8 DVNC (TROCAR) ×1
OBTURATOR OPTICALSTD 8 DVNC (TROCAR) ×1 IMPLANT
PACK LAP CHOLECYSTECTOMY (MISCELLANEOUS) ×3 IMPLANT
PENCIL ELECTRO HAND CTR (MISCELLANEOUS) ×3 IMPLANT
RELOAD STAPLER WHITE 60MM (STAPLE) ×1 IMPLANT
SEAL CANN UNIV 5-8 DVNC XI (MISCELLANEOUS) ×4 IMPLANT
SEAL XI 5MM-8MM UNIVERSAL (MISCELLANEOUS) ×8
SET TUBE SMOKE EVAC HIGH FLOW (TUBING) ×3 IMPLANT
SOLUTION ELECTROLUBE (MISCELLANEOUS) ×3 IMPLANT
SPOGE SURGIFLO 8M (HEMOSTASIS) ×2
SPONGE LAP 4X18 RFD (DISPOSABLE) ×3 IMPLANT
SPONGE SURGIFLO 8M (HEMOSTASIS) ×1 IMPLANT
SPONGE VERSALON 4X4 4PLY (MISCELLANEOUS) IMPLANT
STAPLE ECHEON FLEX 60 POW ENDO (STAPLE) ×3 IMPLANT
STAPLER RELOAD WHITE 60MM (STAPLE) ×3
STAPLER SKIN PROX 35W (STAPLE) ×3 IMPLANT
STRAP SAFETY 5IN WIDE (MISCELLANEOUS) ×3 IMPLANT
SURGILUBE 2OZ TUBE FLIPTOP (MISCELLANEOUS) ×3 IMPLANT
SUT DVC VLOC 90 3-0 CV23 UNDY (SUTURE) ×6 IMPLANT
SUT DVC VLOC 90 3-0 CV23 VLT (SUTURE) ×3
SUT ETHILON 3-0 FS-10 30 BLK (SUTURE)
SUT MNCRL 4-0 (SUTURE) ×4
SUT MNCRL 4-0 27XMFL (SUTURE) ×2
SUT PROLENE 5 0 RB 1 DA (SUTURE) IMPLANT
SUT SILK 2 0 SH (SUTURE) IMPLANT
SUT VIC AB 0 CT1 36 (SUTURE) ×3 IMPLANT
SUT VIC AB 2-0 CT1 (SUTURE) ×3 IMPLANT
SUT VICRYL 0 AB UR-6 (SUTURE) ×6 IMPLANT
SUTURE DVC VLC 90 3-0 CV23 VLT (SUTURE) ×1 IMPLANT
SUTURE EHLN 3-0 FS-10 30 BLK (SUTURE) IMPLANT
SUTURE MNCRL 4-0 27XMF (SUTURE) ×2 IMPLANT
SYR TOOMEY IRRIG 70ML (MISCELLANEOUS) ×3
SYRINGE TOOMEY IRRIG 70ML (MISCELLANEOUS) ×1 IMPLANT
TAPE CLOTH 3X10 WHT NS LF (GAUZE/BANDAGES/DRESSINGS) ×6 IMPLANT
TROCAR XCEL 12X100 BLDLESS (ENDOMECHANICALS) ×3 IMPLANT
TROCAR XCEL NON-BLD 5MMX100MML (ENDOMECHANICALS) ×3 IMPLANT

## 2021-04-25 NOTE — Transfer of Care (Signed)
Immediate Anesthesia Transfer of Care Note  Patient: Francis Monroe  Procedure(s) Performed: XI ROBOTIC ASSISTED LAPAROSCOPIC RADICAL PROSTATECTOMY LYMPH NODE DISSECTION  Patient Location: PACU  Anesthesia Type:General  Level of Consciousness: awake, drowsy and patient cooperative  Airway & Oxygen Therapy: Patient Spontanous Breathing and Patient connected to face mask oxygen  Post-op Assessment: Report given to RN and Post -op Vital signs reviewed and stable  Post vital signs: Reviewed and stable  Last Vitals:  Vitals Value Taken Time  BP 119/77 04/25/21 1130  Temp    Pulse 94 04/25/21 1133  Resp 18 04/25/21 1133  SpO2 95 % 04/25/21 1133  Vitals shown include unvalidated device data.  Last Pain:  Vitals:   04/25/21 0631  TempSrc: Tympanic  PainSc: 0-No pain         Complications: No notable events documented.

## 2021-04-25 NOTE — H&P (Signed)
   04/25/21 7:00 AM   Francis Monroe 1961/02/27 [id number]  CC: Prostate cancer  HPI: 60 year old male with HIV that is undetectable and otherwise very healthy who was recently found to have an elevated PSA of 6.3 and prostate biopsy on 12/20/2020 showed a 33 g prostate with 7/12 cores positive for prostate adenocarcinoma.  This was primarily 3+4=7 disease, but there was a single core of Gleason score 4+3 =7 with 80% pattern 4 and 44% max core involvement.  I personally viewed and interpreted the staging imaging with CT scan completed on 01/13/2021 which shows no evidence of metastatic disease, prostate measures 30 g.     PMH: Past Medical History:  Diagnosis Date   Arthritis    Hypertension    Prostate CA [facility])     Surgical History: Past Surgical History:  Procedure Laterality Date   COLONOSCOPY     NO PAST SURGERIES     TONSILLECTOMY        Family History: Family History  Problem Relation Age of Onset   Healthy Mother    Stroke Father     Social History:  reports that he has never smoked. He has never used smokeless tobacco. He reports previous alcohol use. He reports previous drug use.  Physical Exam: BP (!) 158/105   Pulse 88   Temp 98.1 F (36.7 C) (Tympanic)   Resp 20   Ht 5\' 11"  (1.803 m)   Wt 112.9 kg   SpO2 98%   BMI 34.73 kg/m    Constitutional:  Alert and oriented, No acute distress. Cardiovascular: Regular rate and rhythm Respiratory: clear to auscultation bilaterally GI: Abdomen is soft, nontender, nondistended, no abdominal masses   Assessment & Plan:   60 year old male with unfavorable intermediate risk prostate cancer.  We discussed robotic prostatectomy +/- lymphadenectomy at length.  The procedure takes 3 to 4 hours, and patient's typically discharge home on post-op day #1.  A Foley catheter is left in place for 7 to 10 days to allow for healing of the vesicourethral anastomosis.  There is a small risk of bleeding, infection, damage to  surrounding structures or bowel, hernia, DVT/PE, or serious cardiac or pulmonary complications.  We discussed at length post-op side effects including erectile dysfunction, and the importance of pre-operative erectile function on long-term outcomes.  Even with a nerve sparing approach, there is an approximately 25% rate of permanent erectile dysfunction.  We also discussed postop urinary incontinence at length.  We expect patients to have stress incontinence post-operatively that will improve over period of weeks to months.  Less than 10% of men will require a pad at 1 year after surgery.  Patients will need to avoid heavy lifting and strenuous activity for 3 to 4 weeks, but most men return to their baseline activity status by 6 weeks.  Robotic prostatectomy and LN dissection  Nickolas Madrid, MD 04/25/2021  [facility] [address], [place], [place] [address] [phone]

## 2021-04-25 NOTE — Anesthesia Procedure Notes (Signed)
Procedure Name: Intubation Date/Time: 04/25/2021 7:51 AM Performed by: Kelton Pillar, CRNA Pre-anesthesia Checklist: Patient identified, Emergency Drugs available, Suction available and Patient being monitored Patient Re-evaluated:Patient Re-evaluated prior to induction Oxygen Delivery Method: Circle system utilized Preoxygenation: Pre-oxygenation with 100% oxygen Induction Type: IV induction Ventilation: Mask ventilation without difficulty Laryngoscope Size: McGraph and 3 Grade View: Grade I Tube type: Oral Number of attempts: 1 Airway Equipment and Method: Stylet and Oral airway Placement Confirmation: ETT inserted through vocal cords under direct vision, positive ETCO2, breath sounds checked- equal and bilateral and CO2 detector Secured at: 21 cm Tube secured with: Tape Dental Injury: Teeth and Oropharynx as per pre-operative assessment

## 2021-04-25 NOTE — Anesthesia Postprocedure Evaluation (Signed)
Anesthesia Post Note  Patient: Deontez Klinke  Procedure(s) Performed: XI ROBOTIC ASSISTED LAPAROSCOPIC RADICAL PROSTATECTOMY LYMPH NODE DISSECTION  Patient location during evaluation: PACU Anesthesia Type: General Level of consciousness: awake and alert and oriented Pain management: pain level controlled Vital Signs Assessment: post-procedure vital signs reviewed and stable Respiratory status: spontaneous breathing, nonlabored ventilation and respiratory function stable Cardiovascular status: blood pressure returned to baseline and stable Postop Assessment: no signs of nausea or vomiting Anesthetic complications: no   No notable events documented.   Last Vitals:  Vitals:   04/25/21 1314 04/25/21 1315  BP: 116/72 116/72  Pulse: 83 81  Resp: 16 13  Temp:    SpO2: 98% 97%    Last Pain:  Vitals:   04/25/21 1314  TempSrc:   PainSc: Asleep                 Arkin Imran

## 2021-04-25 NOTE — Anesthesia Preprocedure Evaluation (Signed)
Anesthesia Evaluation  Patient identified by MRN, date of birth, ID band Patient awake    Reviewed: Allergy & Precautions, NPO status , Patient's Chart, lab work & pertinent test results  History of Anesthesia Complications Negative for: history of anesthetic complications  Airway Mallampati: II  TM Distance: >3 FB Neck ROM: Full    Dental  (+) Missing   Pulmonary neg pulmonary ROS, neg sleep apnea, neg COPD,    breath sounds clear to auscultation- rhonchi (-) wheezing      Cardiovascular Exercise Tolerance: Good hypertension, Pt. on medications (-) CAD, (-) Past MI, (-) Cardiac Stents and (-) CABG  Rhythm:Regular Rate:Normal - Systolic murmurs and - Diastolic murmurs    Neuro/Psych neg Seizures negative neurological ROS  negative psych ROS   GI/Hepatic negative GI ROS, Neg liver ROS,   Endo/Other  neg diabetesHypothyroidism   Renal/GU negative Renal ROS     Musculoskeletal  (+) Arthritis ,   Abdominal (+) + obese,   Peds  Hematology negative hematology ROS (+)   Anesthesia Other Findings Past Medical History: No date: Arthritis No date: Hypertension No date: Prostate CA (HCC) HIV  Reproductive/Obstetrics                             Anesthesia Physical Anesthesia Plan  ASA: 3  Anesthesia Plan: General   Post-op Pain Management:    Induction: Intravenous  PONV Risk Score and Plan: 1 and Ondansetron, Dexamethasone and Midazolam  Airway Management Planned: Oral ETT  Additional Equipment:   Intra-op Plan:   Post-operative Plan: Extubation in OR  Informed Consent: I have reviewed the patients History and Physical, chart, labs and discussed the procedure including the risks, benefits and alternatives for the proposed anesthesia with the patient or authorized representative who has indicated his/her understanding and acceptance.     Dental advisory given  Plan Discussed  with: CRNA and Anesthesiologist  Anesthesia Plan Comments:         Anesthesia Quick Evaluation

## 2021-04-25 NOTE — Op Note (Signed)
Date of procedure: 04/25/2021   Preoperative diagnosis:  Unfavorable intermediate risk prostate cancer   Postoperative diagnosis:  Same   Procedure: Da Vinci robotic radical prostatectomy Bilateral pelvic lymph node dissection   Surgeon: Nickolas Madrid, MD   Assistant: Hollice Espy, MD   Anesthesia: General   Complications: None   Intraoperative findings:  1.  Uncomplicated robotic prostatectomy and pelvic LN dissection 2.  Watertight vesicourethral anastomosis   EBL: 150cc   Specimens:  1.  Prostate and seminal vesicles 2. Right pelvic lymph nodes 3. Left pelvic lymph nodes   Drains: 20 French 2-way Foley   Indication: Francis Monroe is a 60 y.o. male with unfavorable intermediate risk prostate cancer   We discussed treatment options including surgery or radiation, and he elected for robotic prostatectomy.  After reviewing the management options for treatment, they elected to proceed with the above surgical procedure(s). We have discussed the potential benefits and risks of the procedure, side effects of the proposed treatment, the likelihood of the patient achieving the goals of the procedure, and any potential problems that might occur during the procedure or recuperation. Informed consent has been obtained. We specifically discussed the risks of bleeding, infection, bowel injury, injury to surrounding structures, incontinence, erectile dysfunction, urine leak, possible need for prolonged foley or drain placement, need for long term PSA monitoring, risk of recurrence of disease, and possible need for salvage radiation in the future.   Description of procedure:   The patient was taken to the operating room and general anesthesia was induced. SCDs were placed for DVT prophylaxis, and 5000 units subcutaneous heparin were given.  The patient was placed in the dorsal lithotomy position, carefully padded with the arms against the sides, prepped and draped in the usual sterile  fashion, and preoperative antibiotics(Ancef) were administered. A preoperative time-out was performed.    On the field, an 62 [place] Foley catheter passed easily into the bladder with return of clear yellow urine.  An 8 mm incision was made above the umbilicus, and a Veress needle was used to obtain access to the peritoneum.  The abdomen was insufflated to 15 mmHg, and the robotic ports were placed in standard fashion under direct vision. A Carter-Thomason device was used to pre-place an 0-vicryl suture in the right lateral 60mm assistant port under direct vision. Thorough inspection of the abdomen showed no bowel injury or other abnormal findings.  Lidocaine was injected into all port sites prior to placement.  The patient was then placed in steep Trendelenburg position and the robot was docked.   I started by entering the space of Retzius and taking down the bladder.  Once this was free to the vas deferens bilaterally, the periprostatic fat was cleaned off the prostate and sent for permanent.  The endopelvic fascia was opened bilaterally, and muscle fibers spared laterally.  The periprostatic ligaments were carefully taken down.  The DVC was isolated, and ligated using a 60 mm load vascular stapler.    I then turned my attention to the anterior bladder neck, and this was incised down to the level of the Foley catheter.  The Foley catheter was removed from the bladder and gently held upward for traction.  The posterior bladder neck was carefully divided, and we identified the plane between the prostate and the seminal vesicles.  The ampulla of the vas was divided bilaterally, and held up for retraction.  The seminal vesicles were then dissected out bilaterally intact.  [place]iers fascia was opened posteriorly to the  prostate and freed laterally. The pedicles were taken down using weck clips, and cautery was minimized. A nerve spare was performed on the right side with his extensive disease on the left  side. The prostate was then gently retracted superiorly, and the apex was dissected free.  Once the urethra was identified, the use of electrocautery was minimized.  The urethra was sharply entered and the Foley catheter removed.  The posterior urethral plate was divided. The specimen was placed in an Endo Catch bag and moved out of the field.  There was good hemostasis in the pelvis.   A lymph node dissection was then performed. I started on the right side and removed the lymphatic tissue in the obturator fossa with the borders of the external iliac vein laterally, node of [place]et distally, the bladder medially, and the obturator nerve. An identical procedure was performed on the left side. Weck clips were used to clip visualized lymphatics.   A 3-0 V lock stitch was used to reapproximate the tissue behind the bladder and posteriorly to the urethra as described by Rocco in order to remove tension from the anastomosis.  Two 3-0 V lock stitches were connected and used for the urethrovesical anastomosis.  A running anastomosis was performed circumferentially and there appeared to be good approximation of the mucosa on both sides.  A new 43 French Foley passed easily into the bladder. With the sutures tied anteriorly and 12cc in the balloon, 120 cc normal saline were instilled with no leakage noted in the operative field. There was good hemostasis.  A small piece of Surgicel was placed on either side of the anastomosis along the endopelvic fascia in addition to Surgi-Flo, as well as in the obturator fossa bilaterally. All instruments were removed and the robot was undocked.   The supraumbilical incision was extended superiorly, and the specimen was removed.  The pre-placed fascial suture in the 97mm right lateral assistant port was tied down. The fascia of the small midline incision was closed using a running 0 Vicryl.  All port sites were copiously irrigated.  There was good hemostasis.  A running 4-0 Monocryl  was used to close all incisions, and Dermabond was applied.   All counts were correct, and the patient was awakened and taken to PACU in stable condition.   Disposition: Stable to PACU   Plan: Anticipate discharge home tomorrow with catheter in place for 1 week  Nickolas Madrid, MD 04/25/2021

## 2021-04-26 ENCOUNTER — Encounter: Payer: Self-pay | Admitting: Urology

## 2021-04-26 DIAGNOSIS — C61 Malignant neoplasm of prostate: Secondary | ICD-10-CM | POA: Diagnosis not present

## 2021-04-26 LAB — CBC
HCT: 37.9 % — ABNORMAL LOW (ref 39.0–52.0)
Hemoglobin: 12.8 g/dL — ABNORMAL LOW (ref 13.0–17.0)
MCH: 30.1 pg (ref 26.0–34.0)
MCHC: 33.8 g/dL (ref 30.0–36.0)
MCV: 89.2 fL (ref 80.0–100.0)
Platelets: 242 10*3/uL (ref 150–400)
RBC: 4.25 MIL/uL (ref 4.22–5.81)
RDW: 14.3 % (ref 11.5–15.5)
WBC: 11.1 10*3/uL — ABNORMAL HIGH (ref 4.0–10.5)
nRBC: 0 % (ref 0.0–0.2)

## 2021-04-26 LAB — BASIC METABOLIC PANEL
Anion gap: 7 (ref 5–15)
BUN: 13 mg/dL (ref 8–23)
CO2: 25 mmol/L (ref 22–32)
Calcium: 8.6 mg/dL — ABNORMAL LOW (ref 8.9–10.3)
Chloride: 103 mmol/L (ref 98–111)
Creatinine, Ser: 1.08 mg/dL (ref 0.61–1.24)
GFR, Estimated: >60 mL/min (ref >60)
Glucose, Bld: 130 mg/dL — ABNORMAL HIGH (ref 70–99)
Potassium: 4.2 mmol/L (ref 3.5–5.1)
Sodium: 135 mmol/L (ref 135–145)

## 2021-04-26 MED ORDER — CHLORHEXIDINE GLUCONATE CLOTH 2 % EX PADS
6.0000 | MEDICATED_PAD | Freq: Every day | CUTANEOUS | Status: DC
  Administered 2021-04-26 – 2021-04-27 (×2): 6 via TOPICAL

## 2021-04-26 MED ORDER — NAPROXEN 500 MG PO TABS
500.0000 mg | ORAL_TABLET | Freq: Once | ORAL | Status: AC
  Administered 2021-04-26: 500 mg via ORAL
  Filled 2021-04-26: qty 1

## 2021-04-26 MED ORDER — NAPROXEN SODIUM 550 MG PO TABS: 550.0000 mg | ORAL_TABLET | Freq: Once | ORAL | Status: DC

## 2021-04-26 MED ORDER — OXYBUTYNIN CHLORIDE 5 MG PO TABS
5.0000 mg | ORAL_TABLET | Freq: Three times a day (TID) | ORAL | Status: DC
  Administered 2021-04-27 (×2): 5 mg via ORAL
  Filled 2021-04-26 (×2): qty 1

## 2021-04-26 MED ORDER — TRAMADOL HCL 50 MG PO TABS
50.0000 mg | ORAL_TABLET | Freq: Four times a day (QID) | ORAL | Status: DC | PRN
Start: 2021-04-26 — End: 2021-04-26
  Administered 2021-04-26: 50 mg via ORAL
  Filled 2021-04-26: qty 1

## 2021-04-26 MED ORDER — TRAMADOL HCL 50 MG PO TABS
50.0000 mg | ORAL_TABLET | Freq: Four times a day (QID) | ORAL | Status: DC | PRN
  Administered 2021-04-26 – 2021-04-27 (×2): 100 mg via ORAL
  Filled 2021-04-26 (×2): qty 2

## 2021-04-26 MED ORDER — KETOROLAC TROMETHAMINE 30 MG/ML IJ SOLN
30.0000 mg | Freq: Three times a day (TID) | INTRAMUSCULAR | Status: DC | PRN
  Administered 2021-04-27 (×2): 30 mg via INTRAVENOUS
  Filled 2021-04-26 (×2): qty 1

## 2021-04-26 MED ORDER — TRAMADOL HCL 50 MG PO TABS: 50.0000 mg | ORAL_TABLET | Freq: Four times a day (QID) | ORAL | 0 refills | Status: DC | PRN

## 2021-04-26 NOTE — Progress Notes (Addendum)
Mobility Specialist - Progress Note   04/26/21 1100  Mobility  Activity Ambulated in hall  Level of Assistance Standby assist, set-up cues, supervision of patient - no hands on  Assistive Device Front wheel walker  Distance Ambulated (ft) 200 ft  Mobility Ambulated with assistance in hallway  Mobility Response Tolerated well  Mobility performed by Mobility specialist  $Mobility charge 1 Mobility    Pre-mobility: 91 HR, 94% SpO2 Post-mobility: 88 HR, 96% SpO2   MinA to exit bed via log roll technique. Pain 8/9 with movement, meds given prior to activity. Pt ambulated in hallway with RW. No LOB. 3 short rest breaks to manage pain as pt reports cramping at incision site with ambulation. No SOB on RA. Did ambulate last 10' without AD, tolerated well.   Kathee Delton Mobility Specialist 04/26/21, 11:25 AM

## 2021-04-26 NOTE — Plan of Care (Signed)

## 2021-04-26 NOTE — Progress Notes (Signed)
Urology Inpatient Progress Note  Subjective: No acute events overnight. WBC count stable today, 11.1.  Creatinine stable, 1.08. Foley catheter in place draining light pink urine. This morning, patient reports mild abdominal soreness with increased intra-abdominal pressure.  He states he had some right upper lip swelling postoperatively that began to resolve, however he noticed it worsened again this morning after receiving p.o. Norco.  I have added Norco to his allergy list.  He denies pruritus, tongue swelling, and shortness of breath. Throughout the day, patient ambulated on the unit and noted increased pain with this.  He also reports bothersome abdominal bloating and constipation.  He wishes to spend another night in the hospital for pain control.  Anti-infectives: Anti-infectives (From admission, onward)    Start     Dose/Rate Route Frequency Ordered Stop   04/25/21 2200  Dolutegravir-lamiVUDine 50-300 MG TABS 1 tablet  Status:  Discontinued        1 tablet Oral Daily at bedtime 04/25/21 1517 04/25/21 1540   04/25/21 2200  dolutegravir (TIVICAY) tablet 50 mg       See Hyperspace for full Linked Orders Report.   50 mg Oral Daily at bedtime 04/25/21 1541     04/25/21 2200  lamiVUDine (EPIVIR) tablet 300 mg       See Hyperspace for full Linked Orders Report.   300 mg Oral Daily at bedtime 04/25/21 1541     04/25/21 0621  ceFAZolin (ANCEF) 2-4 GM/100ML-% IVPB       Note to Pharmacy: Doreen Salvage   : cabinet override      04/25/21 0621 04/25/21 0808   04/25/21 0209  ceFAZolin (ANCEF) IVPB 2g/100 mL premix        2 g 200 mL/hr over 30 Minutes Intravenous 30 min pre-op 04/25/21 0209 04/25/21 0756       Current Facility-Administered Medications  Medication Dose Route Frequency Provider Last Rate Last Admin   acetaminophen (TYLENOL) tablet 650 mg  650 mg Oral Q4H PRN [name], MD       amLODipine (NORVASC) tablet 5 mg  5 mg Oral QHS Dorothe Pea, RPH   5 mg at 04/25/21 2201    atorvastatin (LIPITOR) tablet 10 mg  10 mg Oral QHS Dorothe Pea, RPH   10 mg at 04/25/21 2201   Chlorhexidine Gluconate Cloth 2 % PADS 6 each  6 each Topical Daily [name], MD   6 each at 04/26/21 1700   docusate sodium (COLACE) capsule 100 mg  100 mg Oral BID [name], MD   100 mg at 04/26/21 0921   dolutegravir (TIVICAY) tablet 50 mg  50 mg Oral QHS Dorothe Pea, RPH   50 mg at 04/25/21 2200   And   lamiVUDine (EPIVIR) tablet 300 mg  300 mg Oral QHS Dorothe Pea, RPH   300 mg at 04/25/21 2200   heparin injection 5,000 Units  5,000 Units Subcutaneous Q8H Nickolas Madrid C, MD   5,000 Units at 04/26/21 1345   HYDROmorphone (DILAUDID) injection 0.5-1 mg  0.5-1 mg Intravenous Q2H PRN [name], MD   1 mg at 04/26/21 1700   ondansetron (ZOFRAN) injection 4 mg  4 mg Intravenous Q4H PRN [name], MD       oxybutynin (DITROPAN) tablet 5 mg  5 mg Oral Q8H PRN [name], MD       traMADol Veatrice Bourbon) tablet 50-100 mg  50-100 mg Oral Q6H PRN [name], MD  vitamin B-12 (CYANOCOBALAMIN) tablet 2,500 mcg  2,500 mcg Oral Daily Dorothe Pea, RPH   2,500 mcg at 04/26/21 7544   Objective: Vital signs in last 24 hours: Temp:  [97.8 F (36.6 C)-99.1 F (37.3 C)] 99.1 F (37.3 C) (06/21 1520) Pulse Rate:  [85-100] 100 (06/21 1521) Resp:  [16-20] 20 (06/21 1520) BP: (134-162)/(88-103) 162/100 (06/21 1521) SpO2:  [95 %-99 %] 96 % (06/21 1520)  Intake/Output from previous day: 06/20 0701 - 06/21 0700 In: 3180 [P.O.:480; I.V.:1700; IV Piggyback:1000] Out: 2910 [Urine:2910] Intake/Output this shift: Total I/O In: 360 [P.O.:360] Out: 1050 [Urine:1050]  Physical Exam Vitals and nursing note reviewed.  Constitutional:      General: He is not in acute distress.    Appearance: He is not ill-appearing, toxic-appearing or diaphoretic.  HENT:     Head: Normocephalic and atraumatic.  Pulmonary:     Effort: Pulmonary effort is normal. No  respiratory distress.  Genitourinary:    Comments: Surgical incisions visualized over the anterior abdomen, all clean, dry, and intact with overlying surgical adhesive.  No rebound or guarding. Skin:    General: Skin is warm and dry.  Neurological:     Mental Status: He is alert and oriented to person, place, and time.  Psychiatric:        Mood and Affect: Mood normal.        Behavior: Behavior normal.   Lab Results:  Recent Labs    04/25/21 1542 04/26/21 0503  WBC 12.8* 11.1*  HGB 13.8 12.8*  HCT 41.0 37.9*  PLT 251 242   BMET Recent Labs    04/26/21 0503  NA 135  K 4.2  CL 103  CO2 25  GLUCOSE 130*  BUN 13  CREATININE 1.08  CALCIUM 8.6*   Assessment & Plan: 60 year old male POD 1 from robotic assisted radical prostatectomy with bilateral pelvic lymph node dissection with Dr. Diamantina Providence for management of unfavorable intermediate risk prostate cancer.    He had a possible allergic reaction to Norco overnight.  Norco added to his allergy list and we have switched him to tramadol for pain control.  Additionally, we will add Colace for management of postoperative constipation.  Okay to keep him overnight tonight for pain control.  Diet advanced, encourage ambulation.  If pain is well controlled tomorrow, okay for discharge at that time.  Debroah Loop, PA-C 04/26/2021

## 2021-04-26 NOTE — Progress Notes (Signed)
Mobility Specialist - Progress Note   04/26/21 1300  Mobility  Activity Ambulated in hall  Level of Assistance Standby assist, set-up cues, supervision of patient - no hands on  Assistive Device Front wheel walker;None  Distance Ambulated (ft) 350 ft  Mobility Ambulated with assistance in hallway  Mobility Response Tolerated well  Mobility performed by Mobility specialist  $Mobility charge 1 Mobility    Pt ambulated in hallway 2nd time this date. 2 laps around nursing station, both with and without AD. No LOB. Denied SOB on RA. Steady gait. Still some moderate pain at incision site. Pt still waiting on BM, states "it feels like I have rocks in my stomach".    Kathee Delton Mobility Specialist 04/26/21, 1:23 PM

## 2021-04-27 DIAGNOSIS — C61 Malignant neoplasm of prostate: Secondary | ICD-10-CM | POA: Diagnosis not present

## 2021-04-27 MED ORDER — OXYBUTYNIN CHLORIDE 5 MG PO TABS: 5.0000 mg | ORAL_TABLET | Freq: Three times a day (TID) | ORAL | 0 refills | Status: DC | PRN

## 2021-04-27 NOTE — Progress Notes (Addendum)
Patient scored yellow MEWS. Nurse has just gotten pain to tolerable level as he was a 10/10 reported after 2000, per pt. He had been in pain all day and was trying to not keep taking the pain meds, nurse educated patient on pain meds and importance of controlling pain. Pain was in the bladder and description was given as spasms. Patient visibly in pain at that time, bracing, guarding and unable to speak. Pt. Temp began to rise and he had elevated BP and HR, though has history of HTN. MD notified and Charge nurse refer to flowsheets MEWS.

## 2021-04-27 NOTE — Progress Notes (Signed)
Patient awaiting discharge via wheelchair accompanied by family.  Patient given discharge instruction and able to teach back. Patient discharged with all pertinent information, prescriptions, and personal belongings.  Foley bag changed to Leg bag.  Patient able to demonstrate and state understanding of foley care.  IV site D/ced.  No acute distress noted.  Care relinquished.

## 2021-04-27 NOTE — Discharge Summary (Addendum)
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   Neck pain 11/08/2020   Degeneration of intervertebral disc of cervical region 07/09/2019   Obesity (BMI 30.0-34.9) 07/09/2019   Chest pain 07/26/2018   Other male erectile dysfunction 10/11/2017   History of colonic polyps 07/31/2016   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 Briefly, Francis Monroe is a 60 y.o. year old patient with intermediate risk prostate cancer who underwent robotic radical prostatectomy with bilateral pelvic lymph node dissection on April 25, 2021.  His postoperative course was slightly prolonged due to allergic reaction to Norco pain medication, abdominal bloating and constipation and bladder spasms.  His pain and constipation were able to be controlled through the night and this morning he has passed flatus, had a bowel movement and is no longer experiencing painful bladder spasms.  Hospital Course: Patient tolerated the procedure well.  He was then transferred to the floor after an uneventful PACU stay.  His hospital course was uncomplicated.  On POD#2 he had met discharge criteria: was eating a regular diet, was up  and ambulating independently,  pain was well controlled and was ready to for discharge.  Physical Exam: Blood pressure (!) 132/92, pulse 93, temperature 98.1 F (36.7 C), temperature source Oral, resp. rate 18, height 5' 11"  (1.803 m), weight 112.9 kg, SpO2 93 %.  Constitutional:  Well nourished. Alert and oriented, No acute distress. HEENT: [place] AT, moist mucus membranes.  Trachea midline.  Right upper lip still slightly swollen.   Cardiovascular: No clubbing, cyanosis, or edema. Respiratory: Normal respiratory effort, no increased work of breathing. GI: Abdomen is soft, slightly tender, slightly distended as expected for robotic surgery.  Port sites are healing well clean and dry without erythema. GU: No CVA tenderness.  No bladder fullness or masses.  Patient with uncircumcised phallus. Foreskin easily retracted.   Foley in place draining pink clear urine.    Scrotum without lesions, cysts, rashes and/or edema.   Neurologic: Grossly intact, no focal deficits, moving all 4 extremities. Psychiatric: Normal mood and affect.    Laboratory values:  Recent Labs    04/25/21 1542 04/26/21 0503  WBC 12.8* 11.1*  HGB 13.8 12.8*  HCT 41.0 37.9*   Recent Labs    04/26/21 0503  NA 135  K 4.2  CL 103  CO2 25  GLUCOSE 130*  BUN 13  CREATININE 1.08  CALCIUM 8.6*   No results for input(s): LABPT, INR in the last 72 hours. No results for input(s): LABURIN in the last 72 hours. Results for orders placed or performed during the hospital encounter of 04/22/21  SARS CORONAVIRUS 2 (TAT 6-24 HRS) Nasopharyngeal Nasopharyngeal Swab     Status: None   Collection Time: 04/22/21 11:05 AM   Specimen: Nasopharyngeal Swab  Result Value  Ref Range Status   SARS Coronavirus 2 NEGATIVE NEGATIVE Final    Comment: (NOTE) SARS-CoV-2 target nucleic acids are NOT DETECTED.  The SARS-CoV-2 RNA is generally detectable in upper and lower respiratory specimens during the acute phase of infection.  Negative results do not preclude SARS-CoV-2 infection, do not rule out co-infections with other pathogens, and should not be used as the sole basis for treatment or other patient management decisions. Negative results must be combined with clinical observations, patient history, and epidemiological information. The expected result is Negative.  Fact Sheet for Patients: SugarRoll.be  Fact Sheet for Healthcare Providers: https://www.woods-mathews.com/  This test is not yet approved or cleared by the [place] FDA and  has been authorized for detection and/or diagnosis of SARS-CoV-2 by FDA under an Emergency Use Authorization (EUA). This EUA will remain  in effect (meaning this test can be used) for the duration of the COVID-19 declaration under Se ction 564(b)(1) of the Act, 21 U.S.C. section 360bbb-3(b)(1), unless the authorization is terminated or revoked sooner.  Performed at [facility], [place] [address], [place], [place] [address]    I lightly irrigated his Foley with 30 cc of sterile water and irrigated easily.  Disposition: Home  Discharge instruction: The patient was instructed to be ambulatory but told to refrain from heavy lifting, strenuous activity, or driving.  He is also instructed on Foley catheter care and to have catheter in place for 1 week.    Discharge medications:  Allergies as of 04/27/2021       Reactions   Norco [hydrocodone-acetaminophen] Swelling   Lip swelling after receiving Norco postoperatively on 04/25/2021   Losartan Itching, Rash        Medication List     TAKE these medications    amLODipine-atorvastatin 5-10 MG tablet Commonly known as: CADUET Take 1 tablet by mouth at bedtime.   docusate sodium 100 MG capsule Commonly known as: COLACE Take 1 capsule (100 mg total) by mouth 2 (two) times daily.   Dovato 50-300 MG Tabs Generic drug: Dolutegravir-lamiVUDine Take 1 tablet by mouth at  bedtime.   oxybutynin 5 MG tablet Commonly known as: DITROPAN Take 1 tablet (5 mg total) by mouth every 8 (eight) hours as needed for bladder spasms.   traMADol 50 MG tablet Commonly known as: ULTRAM Take 1 tablet (50 mg total) by mouth every 6 (six) hours as needed for severe pain.   Vitamin B-12 2500 MCG Subl Place 2,500 mcg under the tongue daily.           Followup:   Follow-up Information     [name], MD Follow up on 05/03/2021.   Specialty: Urology Why: For catheter removal and to discuss surgical pathology Contact information: [place] [address] [phone]

## 2021-04-27 NOTE — Plan of Care (Signed)
  Problem: Education: Goal: Knowledge of General Education information will improve Description: Including pain rating scale, medication(s)/side effects and non-pharmacologic comfort measures Outcome: Progressing   Problem: Health Behavior/Discharge Planning: Goal: Ability to manage health-related needs will improve Outcome: Progressing   Problem: Clinical Measurements: Goal: Ability to maintain clinical measurements within normal limits will improve Outcome: Progressing Goal: Will remain free from infection Outcome: Progressing Goal: Diagnostic test results will improve Outcome: Progressing Goal: Respiratory complications will improve Outcome: Progressing Goal: Cardiovascular complication will be avoided Outcome: Progressing   Problem: Activity: Goal: Risk for activity intolerance will decrease Outcome: Progressing   Problem: Nutrition: Goal: Adequate nutrition will be maintained Outcome: Progressing   Problem: Coping: Goal: Level of anxiety will decrease Outcome: Progressing   Problem: Elimination: Goal: Will not experience complications related to bowel motility Outcome: Not Progressing Note: Has not had BM since May 03, 2023 has passed some gas  Goal: Will not experience complications related to urinary retention Outcome: Progressing   Problem: Pain Managment: Goal: General experience of comfort will improve Outcome: Progressing   Problem: Safety: Goal: Ability to remain free from injury will improve Outcome: Progressing   Problem: Skin Integrity: Goal: Risk for impaired skin integrity will decrease Outcome: Progressing

## 2021-04-27 NOTE — Progress Notes (Signed)
Pt went to bathroom and noted that foley was leaking he stated that it began leaking more when he bent down. When nurse came to room to assess pt. Had wash cloth over penis which had small areas of bloody drainage from penis, foley area to it. No active bleeding noted. Balloon checked and had full 10 ml of water, water reinserted to foley. Will notify urology and dayshift nurse.

## 2021-04-28 LAB — SURGICAL PATHOLOGY

## 2021-04-29 ENCOUNTER — Encounter: Payer: Self-pay | Admitting: Urology

## 2021-04-30 NOTE — Progress Notes (Signed)
   04/26/21 2322  Assess: MEWS Score  Temp 99.8 F (37.7 C)  BP (!) 160/105  Pulse Rate (!) 129  SpO2 95 %  Assess: MEWS Score  MEWS Temp 0  MEWS Systolic 0  MEWS Pulse 2  MEWS RR 0  MEWS LOC 0  MEWS Score 2  MEWS Score Color Yellow  Assess: if the MEWS score is Yellow or Red  Were vital signs taken at a resting state? Yes  Focused Assessment No change from prior assessment  Does the patient meet 2 or more of the SIRS criteria? Yes  Does the patient have a confirmed or suspected source of infection? No  MEWS guidelines implemented *See Row Information* Yes  Treat  MEWS Interventions Administered scheduled meds/treatments  Pain Scale 0-10  Pain Score 0  Take Vital Signs  Increase Vital Sign Frequency  Yellow: Q 2hr X 2 then Q 4hr X 2, if remains yellow, continue Q 4hrs  Escalate  MEWS: Escalate Yellow: discuss with charge nurse/RN and consider discussing with provider and RRT  Notify: Charge Nurse/RN  Name of Charge Nurse/RN Notified Phoebe Sharps  Date Charge Nurse/RN Notified 04/26/21  Time Charge Nurse/RN Notified 2324  Notify: Provider  Provider Name/Title Stoio[facility]  Date Provider Notified 04/26/21  Time Provider Notified 2326  Notification Type Call  Notification Reason Change in status (Elevated BP & HR, temp rising)  Provider response Other (Comment) (continue to tx pain and monitor)  Date of Provider Response 04/26/21  Time of Provider Response 2226  Document  Patient Outcome Stabilized after interventions  Assess: SIRS CRITERIA  SIRS Temperature  0  SIRS Pulse 1  SIRS Respirations  0  SIRS WBC 0  SIRS Score Sum  1  Inserted for Lorrin Jackson RN

## 2021-05-04 ENCOUNTER — Encounter: Payer: Self-pay | Admitting: Urology

## 2021-05-04 ENCOUNTER — Ambulatory Visit (INDEPENDENT_AMBULATORY_CARE_PROVIDER_SITE_OTHER): Payer: BC Managed Care – PPO | Admitting: Urology

## 2021-05-04 ENCOUNTER — Other Ambulatory Visit: Payer: Self-pay

## 2021-05-04 VITALS — BP 144/90 | HR 88 | Ht 71.0 in | Wt 240.0 lb

## 2021-05-04 DIAGNOSIS — C61 Malignant neoplasm of prostate: Secondary | ICD-10-CM

## 2021-05-04 DIAGNOSIS — R972 Elevated prostate specific antigen [PSA]: Secondary | ICD-10-CM

## 2021-05-04 MED ORDER — SULFAMETHOXAZOLE-TRIMETHOPRIM 800-160 MG PO TABS
1.0000 | ORAL_TABLET | Freq: Once | ORAL | Status: AC
  Administered 2021-05-04: 1 via ORAL

## 2021-05-04 NOTE — Progress Notes (Signed)
   05/04/2021 9:38 AM   Francis Monroe 1961-02-06 [id number]  Reason for visit: Prostate cancer  HPI: 60 year old male with HIV who was found to have an elevated PSA of 6.3 and underwent a prostate biopsy that showed unfavorable intermediate risk prostate cancer, and staging imaging showed no evidence of metastatic disease.  He underwent an uncomplicated robotic prostatectomy with bilateral pelvic lymph node dissection on 04/25/2021, and pathology showed Gleason score 5+4=9 high risk prostate cancer with intraductal carcinoma present.  Margins were all negative and there was no seminal vesicle or bladder neck invasion or extraprostatic extension.  Lymph nodes were negative.  He has been doing well and is tolerating a general diet and having normal bowel movements.  He denies any significant abdominal pain.  On exam his incisions are healing nicely and the abdomen is nontender nondistended.  We reviewed his pathology results at length and need for close monitoring of the PSA moving forward.  Foley was removed today.  Counseled extensively regarding expected postop stress incontinence and erectile dysfunction over the next few months.  He uses Cialis at baseline.  RTC 2 months with PSA prior   [name], MD  [facility] [address], [place], [place] [address] [phone]

## 2021-05-04 NOTE — Addendum Note (Signed)
Addended by: Despina Hidden on: 05/04/2021 10:45 AM   Modules accepted: Orders

## 2021-05-04 NOTE — Progress Notes (Signed)
Patient ID: Francis Monroe, adult   DOB: Oct 14, 1961, 60 y.o.   MRN: [id number] Catheter Removal  Patient is present today for a catheter removal.  52ml of water was drained from the balloon. A 20FR foley cath was removed from the bladder no complications were noted . Patient tolerated well.  Performed by: Edwin Dada, CMA  Follow up/ Additional notes: 8 weeks

## 2021-05-11 ENCOUNTER — Telehealth: Payer: Self-pay | Admitting: *Deleted

## 2021-05-11 NOTE — Telephone Encounter (Signed)
Patient called in today and states he is having right lower abdomen pain about incision site. Incision site is not red or swollen . No fever or chili's.He has not tried any Advil or Aleve. He states he cant sleep at night. He also said that he told you about this pain in the hospital. He has been moving his bowels and urinating.

## 2021-05-11 NOTE — Telephone Encounter (Signed)
Called patient and spoke with him in regards to pain at incision site. Patient states that on Monday he was getting up from his chair and had a sudden sharp shooting/burning pain internally at his right supra pubic incision site. He states that now every time he moves or bends he experiences a sharp shooting pain at this incision site. Patient was advised per Dr. Diamantina Providence to try OTC NSAIDs and ice to help with discomfort and monitor for now. If symptoms worsen or if he develops new symptoms such as fever, chills or site redness/hot to touch he should call back for further evaluation. Patient verbalized understanding.

## 2021-06-16 ENCOUNTER — Other Ambulatory Visit: Payer: Self-pay

## 2021-06-16 ENCOUNTER — Other Ambulatory Visit: Payer: BC Managed Care – PPO

## 2021-06-16 DIAGNOSIS — R972 Elevated prostate specific antigen [PSA]: Secondary | ICD-10-CM

## 2021-06-17 LAB — PSA: Prostate Specific Ag, Serum: <0.1 ng/mL (ref 0.0–4.0)

## 2021-06-22 ENCOUNTER — Encounter: Payer: Self-pay | Admitting: Urology

## 2021-06-22 ENCOUNTER — Ambulatory Visit (INDEPENDENT_AMBULATORY_CARE_PROVIDER_SITE_OTHER): Payer: BC Managed Care – PPO | Admitting: Urology

## 2021-06-22 ENCOUNTER — Other Ambulatory Visit: Payer: Self-pay

## 2021-06-22 VITALS — Ht 71.0 in | Wt 254.0 lb

## 2021-06-22 DIAGNOSIS — N393 Stress incontinence (female) (male): Secondary | ICD-10-CM

## 2021-06-22 DIAGNOSIS — C61 Malignant neoplasm of prostate: Secondary | ICD-10-CM

## 2021-06-22 NOTE — Patient Instructions (Signed)
You can order a "Cunningham clamp" from [employer] to help with the urinary leakage.  You can wear this when you are active and have significant leakage, most patients will not wear it overnight.  This can significantly decrease the amount of pads and depends you are having to use.  Referral placed to pelvic floor physical therapy to help with Kegel exercises, and improve leakage  Kegel Exercises  Kegel exercises can help strengthen your pelvic floor muscles. The pelvic floor is a group of muscles that support your rectum, small intestine, and bladder. In females, pelvic floor muscles also help support the womb (uterus). These muscles help you control the flow of urine and stool. Kegel exercises are painless and simple, and they do not require any equipment. Your provider may suggest Kegel exercises to: Improve bladder and bowel control. Improve sexual response. Improve weak pelvic floor muscles after surgery to remove the uterus (hysterectomy) or pregnancy (females). Improve weak pelvic floor muscles after prostate gland removal or surgery (males). Kegel exercises involve squeezing your pelvic floor muscles, which are the same muscles you squeeze when you try to stop the flow of urine or keep from passing gas. The exercises can be done while sitting, standing, or lying down, but itis best to vary your position. Exercises How to do Kegel exercises: Squeeze your pelvic floor muscles tight. You should feel a tight lift in your rectal area. If you are a male, you should also feel a tightness in your vaginal area. Keep your stomach, buttocks, and legs relaxed. Hold the muscles tight for up to 10 seconds. Breathe normally. Relax your muscles. Repeat as told by your health care provider. Repeat this exercise daily as told by your health care provider. Continue to do this exercise for at least 4-6 weeks, or for as long as told by your healthcare provider. You may be referred to a physical therapist who  can help you learn more abouthow to do Kegel exercises. Depending on your condition, your health care provider may recommend: Varying how long you squeeze your muscles. Doing several sets of exercises every day. Doing exercises for several weeks. Making Kegel exercises a part of your regular exercise routine. This information is not intended to replace advice given to you by your health care provider. Make sure you discuss any questions you have with your healthcare provider. Document Revised: 10/13/2020 Document Reviewed: 06/12/2018 Elsevier Patient Education  [place].

## 2021-06-22 NOTE — Addendum Note (Signed)
Addended by: Donalee Citrin on: 06/22/2021 10:35 AM   Modules accepted: Orders

## 2021-06-22 NOTE — Progress Notes (Signed)
   06/22/2021 10:30 AM   Ailene Ards 26-Sep-1961 [id number]  Reason for visit: Follow up prostate cancer  HPI: 60 year old African-American male who underwent robotic radical prostatectomy and pelvic lymph node dissection on 04/25/2021 for prostate cancer with final pathology showing Gleason score 5+4 = 9 high risk prostate cancer with intraductal carcinoma present, margins were all negative, and lymph nodes were negative.  Initial postop PSA on 06/16/2021 was undetectable.  We discussed his high risk of recurrence based on his pathology and presence of intraductal carcinoma, and the need for close monitoring of the PSA moving forward.  He is still having significant stress incontinence requiring a depends.  I recommended a Cunningham clamp, as well as referral to pelvic floor physical therapy, and encouragement was provided regarding expected postop course.  Cunningham clamp for stress incontinence, referral placed to pelvic floor physical therapy RTC 1 month with PA for clearance to return to work RTC 4 months with me with PSA prior   [name], MD  [facility] [address], [place], [place] [address] [phone]

## 2021-07-01 ENCOUNTER — Telehealth: Payer: Self-pay

## 2021-07-01 NOTE — Telephone Encounter (Signed)
Pt calls and states that his employer has not received his FMLA paperwork. Paperwork re-faxed. Confirmation received.

## 2021-07-21 ENCOUNTER — Other Ambulatory Visit: Payer: Self-pay

## 2021-07-21 ENCOUNTER — Ambulatory Visit (INDEPENDENT_AMBULATORY_CARE_PROVIDER_SITE_OTHER): Payer: BC Managed Care – PPO | Admitting: Physician Assistant

## 2021-07-21 ENCOUNTER — Telehealth: Payer: Self-pay | Admitting: Physician Assistant

## 2021-07-21 ENCOUNTER — Encounter: Payer: Self-pay | Admitting: Physician Assistant

## 2021-07-21 VITALS — BP 169/101 | HR 78 | Ht 71.0 in | Wt 261.0 lb

## 2021-07-21 DIAGNOSIS — N393 Stress incontinence (female) (male): Secondary | ICD-10-CM

## 2021-07-21 DIAGNOSIS — C61 Malignant neoplasm of prostate: Secondary | ICD-10-CM

## 2021-07-21 DIAGNOSIS — N529 Male erectile dysfunction, unspecified: Secondary | ICD-10-CM

## 2021-07-21 NOTE — Telephone Encounter (Signed)
Please contact the patient and inform him that on further reflection after he left clinic today, I would also like to get him started on a daily low-dose PDE 5 inhibitor to increase his chance of regaining erectile function following his recent prostatectomy.  If he is amenable to this, please send in tadalafil 5 mg daily to [facility] and counsel him to obtain a cost savings coupon for this drug on GoodRx.  Notably, patient is not on nitrates for chest pain and does not have a history of heart failure.

## 2021-07-21 NOTE — Progress Notes (Signed)
07/21/2021 3:55 PM   Francis Monroe [birth year] CC:[id number]  CC: Chief Complaint  Patient presents with   Follow-up   HPI: Francis Monroe is a 60 y.o. male s/p robotic radical prostatectomy and pelvic lymph node dissection on 04/25/2021 for high risk prostate cancer who presents today for follow-up and to discuss return to work.  He was seen in clinic most recently on 06/22/2021 by Dr. Diamantina Providence, at which point he reported bothersome, significant stress incontinence requiring absorbent underwear.  He was counseled to start using a Cunningham clamp and was referred to pelvic floor physical therapy at that time.  Today he reports he was advised that there was a 10-week waiting list to get in for pelvic PT; our pelvic PT service at [facility] was out of network with his insurance.  He has not yet been scheduled for PT.  He attempted to use a Cunningham clamp, however he states that his urinary leakage is so significant that he is concerned that using the clamp will cause urinary tract infection.  Additionally, he states that he was having some penile soreness with use of the Cunningham clamp and discontinued this.    Right now he is just using absorbent underwear, which he lines with absorbent pads.  Despite this, he regularly has urine leak through.  He states he has had to change his pants at social gatherings to his significant urinary leakage.  Overall, he is frustrated with his ongoing urinary leakage and wonders how long this will continue.  He is scheduled to return to work in 4 days and states he is not confident with how well his urinary leakage is managed given that he is quite ambulatory at work. He has not achieved a spontaneous erection since prostatectomy either.  He denies dysuria and pelvic pain.  PMH: Past Medical History:  Diagnosis Date   Arthritis    Hypertension    Prostate CA [facility])     Surgical History: Past Surgical History:  Procedure Laterality Date   COLONOSCOPY      LYMPH NODE DISSECTION N/A 04/25/2021   Procedure: LYMPH NODE DISSECTION;  Surgeon: [name], MD;  Location: [facility] ORS;  Service: Urology;  Laterality: N/A;   NO PAST SURGERIES     ROBOT ASSISTED LAPAROSCOPIC RADICAL PROSTATECTOMY N/A 04/25/2021   Procedure: XI ROBOTIC ASSISTED LAPAROSCOPIC RADICAL PROSTATECTOMY;  Surgeon: [name], MD;  Location: [facility] ORS;  Service: Urology;  Laterality: N/A;   TONSILLECTOMY      Home Medications:  Allergies as of 07/21/2021       Reactions   Norco [hydrocodone-acetaminophen] Swelling   Lip swelling after receiving Norco postoperatively on 04/25/2021   Losartan Itching, Rash        Medication List        Accurate as of July 21, 2021  3:55 PM. If you have any questions, ask your nurse or doctor.          amLODipine-atorvastatin 5-10 MG tablet Commonly known as: CADUET Take 1 tablet by mouth at bedtime.   Dovato 50-300 MG Tabs Generic drug: Dolutegravir-lamiVUDine Take 1 tablet by mouth at bedtime.   nystatin powder Generic drug: nystatin Apply topically 4 (four) times daily.   traMADol 50 MG tablet Commonly known as: ULTRAM Take 1 tablet (50 mg total) by mouth every 6 (six) hours as needed for severe pain.   Vitamin B-12 2500 MCG Subl Place 2,500 mcg under the tongue daily.        Allergies:  Allergies  Allergen Reactions   Norco [Hydrocodone-Acetaminophen] Swelling    Lip swelling after receiving Norco postoperatively on 04/25/2021   Losartan Itching and Rash    Family History: Family History  Problem Relation Age of Onset   Healthy Mother    Stroke Father     Social History:   reports that he has never smoked. He has never used smokeless tobacco. He reports that he does not currently use alcohol. He reports that he does not currently use drugs.  Physical Exam: BP (!) 169/101   Pulse 78   Ht '5\' 11"'$  (1.803 m)   Wt 261 lb (118.4 kg)   BMI 36.40 kg/m   Constitutional:  Alert and oriented, no  acute distress, nontoxic appearing HEENT: [place], AT Cardiovascular: No clubbing, cyanosis, or edema Respiratory: Normal respiratory effort, no increased work of breathing Skin: No rashes, bruises or suspicious lesions Neurologic: Grossly intact, no focal deficits, moving all 4 extremities Psychiatric: Normal mood and affect  Assessment & Plan:   1. Prostate cancer [facility]) S/p prostatectomy, ED and stress incontinence secondary to this as below. No dysuria or pelvic pain. - Ambulatory referral to Physical Therapy  2. Stress incontinence Significant and insufficiently managed with bothersome urinary leakage despite absorbant underwear, dissatisfactory control with Cunningham clamp. We discussed that stress incontinence can last up to a year following prostatectomy and this can be a normal finding. We discussed trying condom catheters, and he was open to this. I provided him with samples today, counseled him on sizing, demonstrated application and removal, and counseled him to contact the office with his preferred sizing so we may order these for him. He expressed understanding.  I would like to extend his return to work date by one week to allow time for him to size himself for condom catheters, receive a shipment of these, and orient himself with daily wear to reduce his risk of urinary leakage accidents given his anticipated mobility at work. Anticipated return to work date 08/01/2021.  Will submit new pelvic floor PT referral today to [facility], which is in network per patient. - Ambulatory referral to Physical Therapy   Return for patient to contact clinic with condom cath sizing.  Debroah Loop, PA-C  [facility] [address] [place], [place] [address] [phone]

## 2021-07-22 ENCOUNTER — Ambulatory Visit: Payer: BC Managed Care – PPO | Admitting: Physician Assistant

## 2021-07-22 MED ORDER — TADALAFIL 5 MG PO TABS: 5.0000 mg | ORAL_TABLET | Freq: Every day | ORAL | 0 refills | Status: DC

## 2021-07-22 NOTE — Telephone Encounter (Signed)
Spoke with patient, gave him message below. Patient is agreeable to this plan. Rx sent. Patient sent and counseled on how to use GoodRx coupon.   Also let patient know his FMLA paperwork has been faxed to the appropriate location with confirmation received. Advised patient to call office should he have any issues with that paper work. Patient verbalized all understandings.

## 2021-08-03 ENCOUNTER — Telehealth: Payer: Self-pay

## 2021-08-03 NOTE — Telephone Encounter (Signed)
Called pt he states that the condom catheter is not effective for him while working. 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 He has resumed using pad and depends. He does not feel that he can wear the [employer] clamp as pt makes his penis sore. In addition he has not been able to get pelvic floor PT as his insurance will only cover [facility] and they do not have a pelvic floor therapist on staff. Pt questions if there are any other options. Please advise.

## 2021-08-03 NOTE — Telephone Encounter (Signed)
Patient called stating that he needs to speak with [place] in regards to his leg bags given at his last visit. Attempted to resolve for patient but he states that it is to complicated to explain and needs to speak with someone who was at the visit that day.

## 2021-08-16 IMAGING — CT CT ABD-PELV W/ CM
1 of 3 series · 13 of 32 positions shown, 18 images · IV contrast (APPLIED)
Comparison: None.

CLINICAL DATA: Prostate cancer.  Staging.

EXAM:
CT ABDOMEN AND PELVIS WITH CONTRAST
TECHNIQUE: Multidetector CT imaging of the abdomen and pelvis was performed
using the standard protocol following bolus administration of
intravenous contrast.
CONTRAST:  125mL OMNIPAQUE IOHEXOL 300 MG/ML  SOLN

[Series 2: axial st · axial · 0.85mm/px · z∈[-976,-506]mm · 13 of 106 slices shown, 18 images]
[im 6/106  soft-tissue]
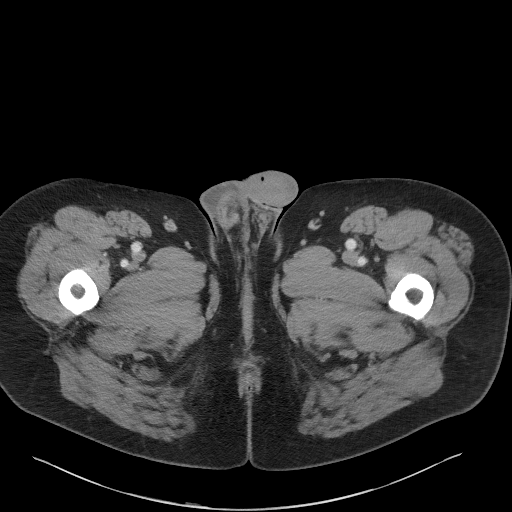
[im 6/106  bone]
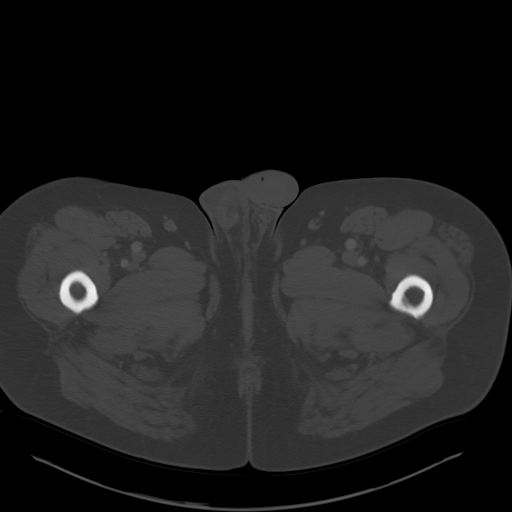
[im 18/106  soft-tissue]
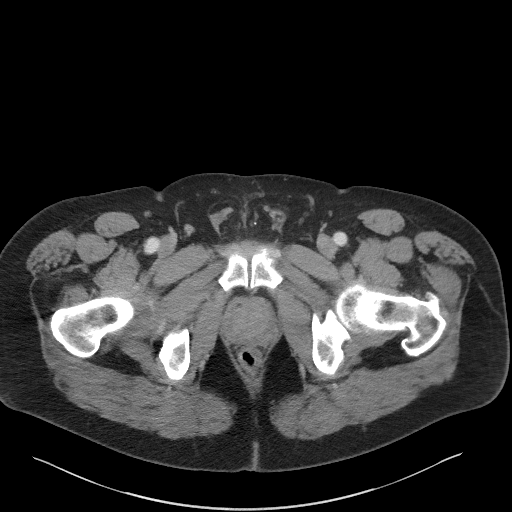
[im 24/106  soft-tissue]
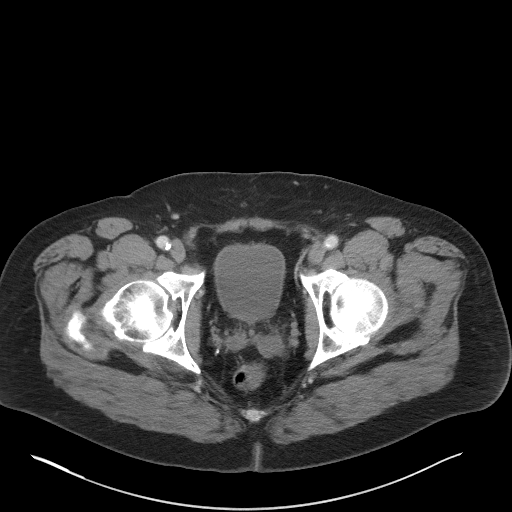
[im 30/106  soft-tissue]
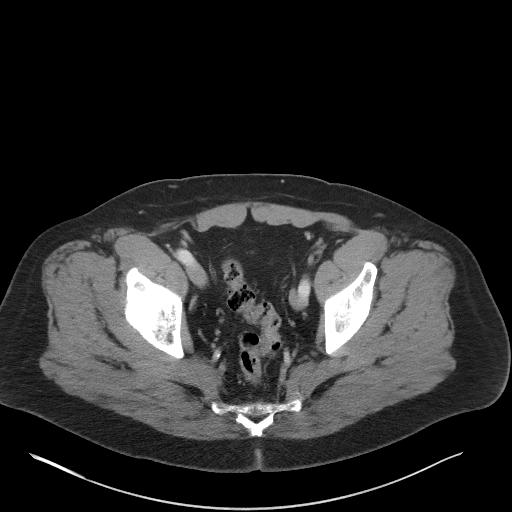
[im 41/106  soft-tissue]
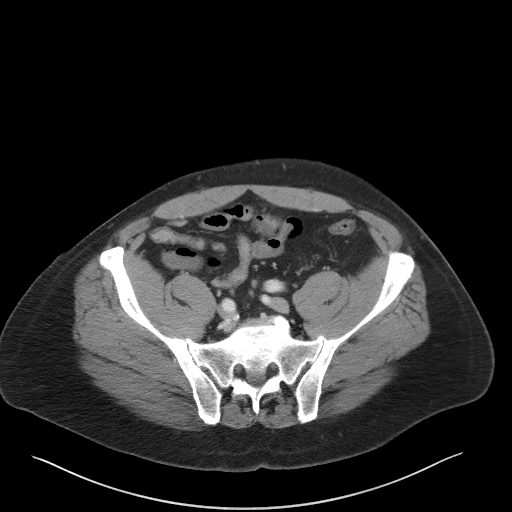
[im 47/106  soft-tissue]
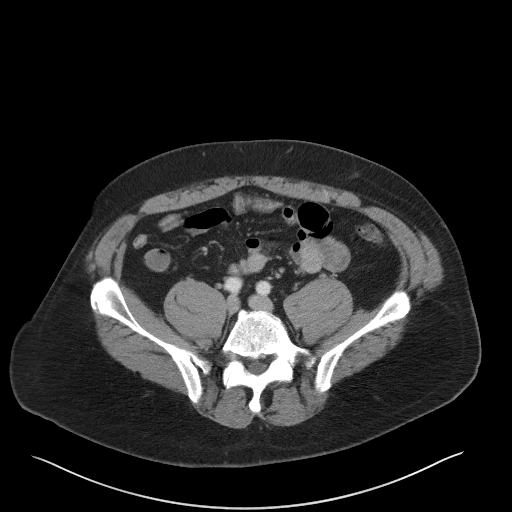
[im 59/106  soft-tissue]
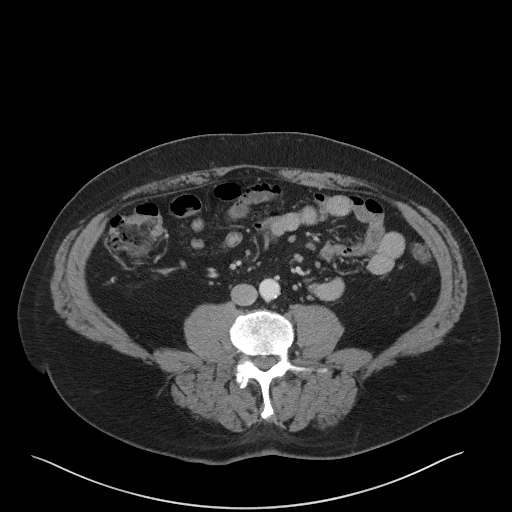
[im 65/106  soft-tissue]
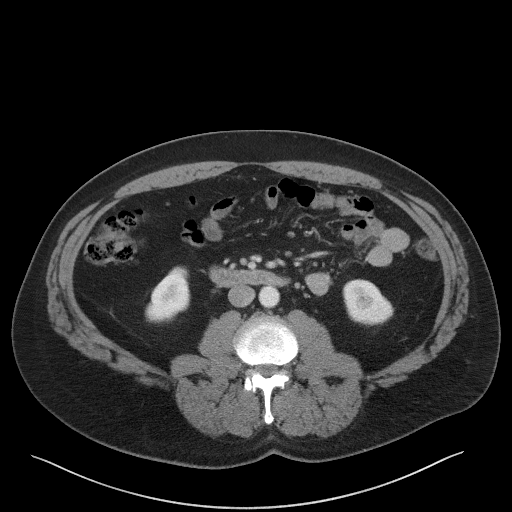
[im 76/106  soft-tissue]
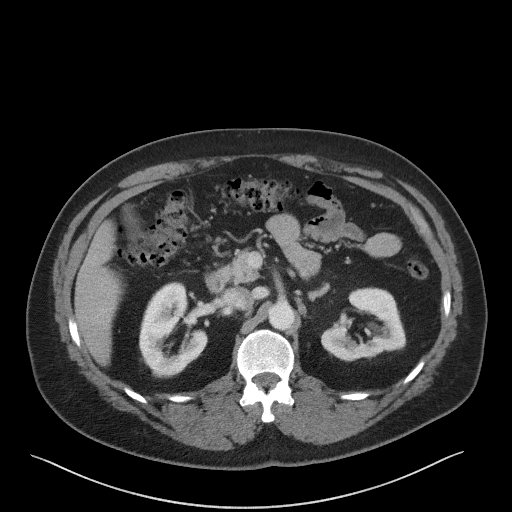
[im 76/106  bone]
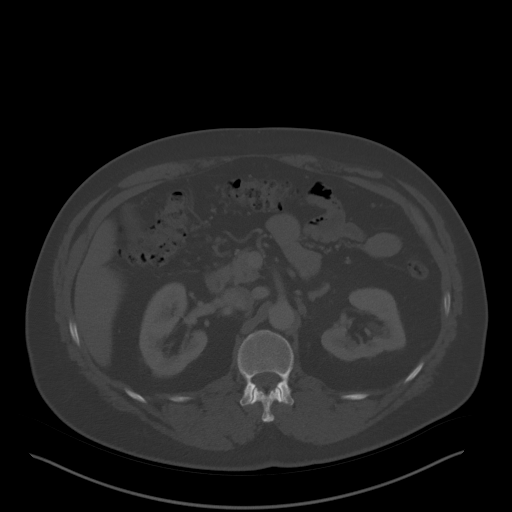
[im 82/106  soft-tissue]
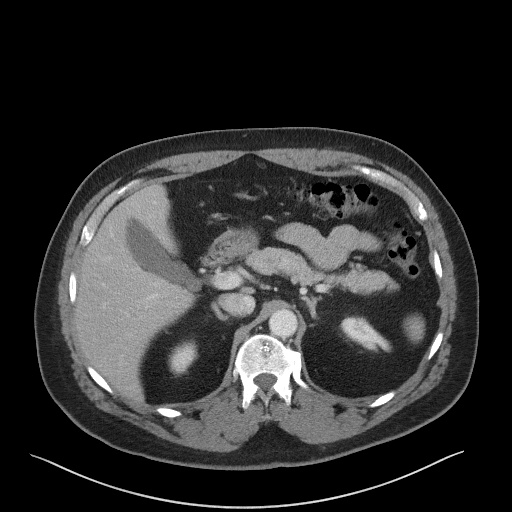
[im 82/106  lung]
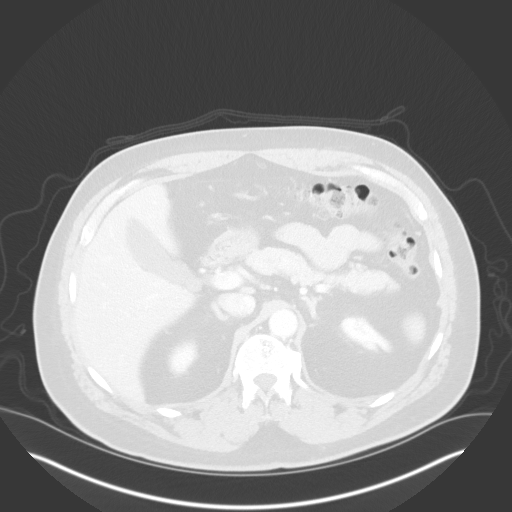
[im 88/106  soft-tissue]
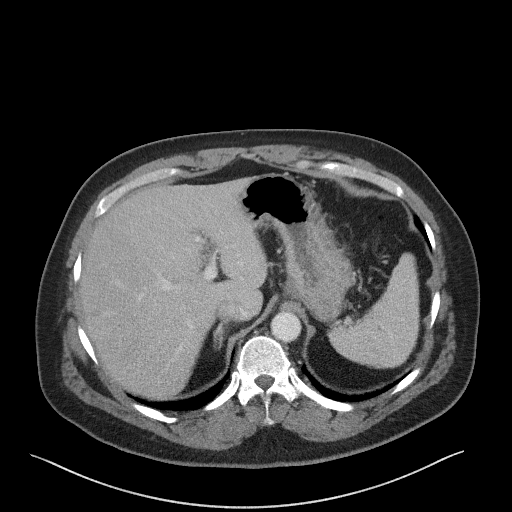
[im 88/106  lung]
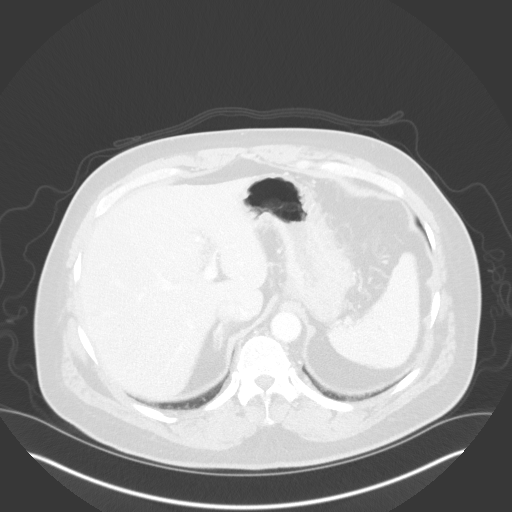
[im 94/106  lung]
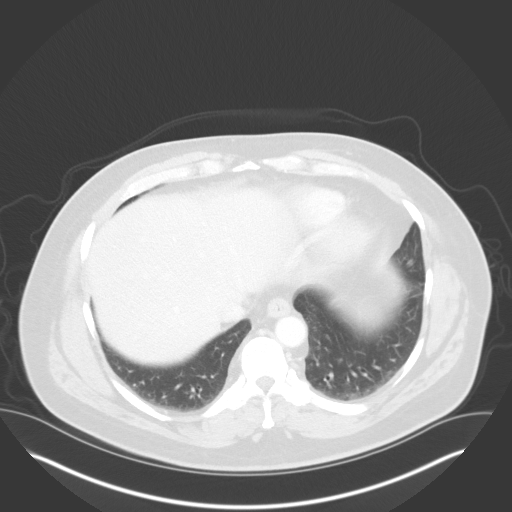
[im 100/106  soft-tissue]
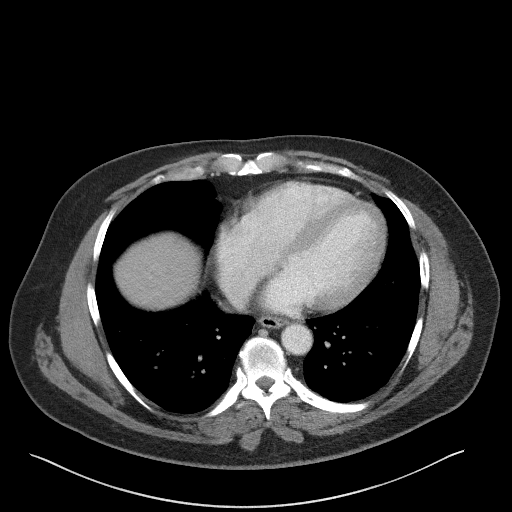
[im 100/106  lung]
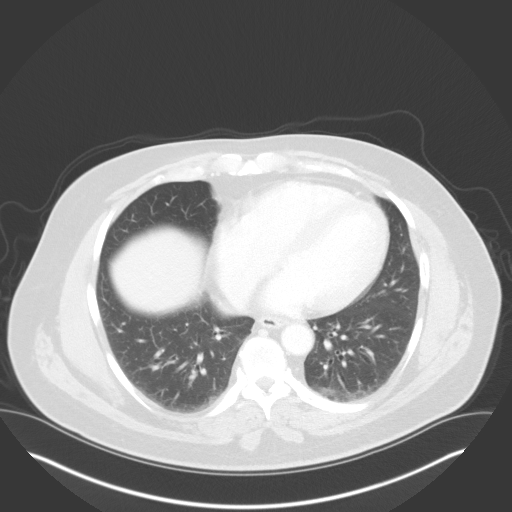

[13 of 32 positions shown; findings below may reference images not displayed]

FINDINGS: Lower chest: Unremarkable

Hepatobiliary: Heterogeneous attenuation of liver parenchyma noted
without distortion of vascular anatomy, features suggesting
geographic fatty deposition. 14 mm hypervascular lesion noted in the
right liver (image 19/series 2. There is no evidence for gallstones,
gallbladder wall thickening, or pericholecystic fluid. No
intrahepatic or extrahepatic biliary dilation.

Pancreas: No focal mass lesion. No dilatation of the main duct. No
intraparenchymal cyst. No peripancreatic edema.

Spleen: No splenomegaly. No focal mass lesion.

Adrenals/Urinary Tract: No adrenal nodule or mass. Kidneys
unremarkable. No evidence for hydroureter. The urinary bladder
appears normal for the degree of distention.

Stomach/Bowel: Stomach is unremarkable. No gastric wall thickening.
No evidence of outlet obstruction. Duodenum is normally positioned
as is the ligament of Treitz. No small bowel wall thickening. No
small bowel dilatation. The terminal ileum is normal. The appendix
is not well visualized, but there is no edema or inflammation in the
region of the cecum. No gross colonic mass. No colonic wall
thickening.

Vascular/Lymphatic: No abdominal aortic aneurysm. No abdominal
aortic atherosclerotic calcification. There is no gastrohepatic or
hepatoduodenal ligament lymphadenopathy. No retroperitoneal or
mesenteric lymphadenopathy. No pelvic sidewall lymphadenopathy.

Reproductive: Prostate gland appears mildly enlarged. No asymmetry
of the seminal vesicles.

Other: No intraperitoneal free fluid.

Musculoskeletal: No worrisome lytic or sclerotic osseous
abnormality.
IMPRESSION: 1. No evidence for metastatic disease in the abdomen/pelvis.
2. 14 mm hypervascular lesion in the right liver. This is probably a
benign process such as a flash filling hemangioma, but MRI of the
abdomen without and with contrast recommended to further evaluate.
3. Probable geographic hepatic steatosis. MRI follow-up could be
confirmatory.

## 2021-10-21 ENCOUNTER — Other Ambulatory Visit: Payer: Self-pay

## 2021-10-21 ENCOUNTER — Other Ambulatory Visit: Payer: BC Managed Care – PPO

## 2021-10-21 DIAGNOSIS — C61 Malignant neoplasm of prostate: Secondary | ICD-10-CM

## 2021-10-22 LAB — PSA: Prostate Specific Ag, Serum: <0.1 ng/mL (ref 0.0–4.0)

## 2021-10-26 ENCOUNTER — Ambulatory Visit (INDEPENDENT_AMBULATORY_CARE_PROVIDER_SITE_OTHER): Payer: BC Managed Care – PPO | Admitting: Urology

## 2021-10-26 ENCOUNTER — Encounter: Payer: Self-pay | Admitting: Urology

## 2021-10-26 ENCOUNTER — Other Ambulatory Visit: Payer: Self-pay

## 2021-10-26 VITALS — Ht 71.0 in | Wt 250.0 lb

## 2021-10-26 DIAGNOSIS — N529 Male erectile dysfunction, unspecified: Secondary | ICD-10-CM | POA: Diagnosis not present

## 2021-10-26 DIAGNOSIS — C61 Malignant neoplasm of prostate: Secondary | ICD-10-CM

## 2021-10-26 DIAGNOSIS — N393 Stress incontinence (female) (male): Secondary | ICD-10-CM

## 2021-10-26 NOTE — Patient Instructions (Signed)
[email or web address] is an online pelvic floor physical therapy, this may be much more cost effective than in person physical therapy, and I would recommend reaching out to them.  Their website is pretty helpful.

## 2021-10-26 NOTE — Progress Notes (Signed)
° °  10/26/2021 9:03 AM   Francis Monroe 10-07-1961 [id number]  Reason for visit: Follow up prostate cancer, stress incontinence, ED   HPI: 60 year old HIV-positive African-American male who underwent robotic radical prostatectomy and pelvic lymph node dissection on 04/25/2021 for prostate cancer with final pathology showing Gleason score 5+4 = 9 high risk prostate cancer with intraductal carcinoma present, margins were all negative, and lymph nodes were negative.  Only a right-sided nerve spare was performed with his high volume disease on the left side.   Initial postop PSA on 06/16/2021 was undetectable, and PSA remains undetectable on most recent PSA 10/21/2021.   We discussed his high risk of recurrence based on his pathology and presence of intraductal carcinoma, and the need for close monitoring of the PSA moving forward.   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  He does not leak when he sitting or overnight.  Has been on Cialis 5 mg daily, but no erections thus far.  I had another long conversation with the patient about postop expectations after prostatectomy, especially with his high risk disease and need for only partial nerve sparing.  He has not been able to see pelvic floor physical therapy secondary to insurance limitations.  I recommended [email or web address] as a potentially lower cost pelvic floor physical therapy option.  He has previously tried [employer] clamp which was uncomfortable for him, and a condom catheter which also was not helpful.  At our next visit in June 2023, we will consider referral for artificial urinary sphincter and/or IPP pending patient improvable and preference.  Okay to continue Cialis 5 mg daily for ED Lab visit 3 months PSA, call with results RTC 6 months PSA prior  [name], MD  [place] [address], [place], [place]  [address] [phone]

## 2022-01-30 ENCOUNTER — Other Ambulatory Visit: Payer: Self-pay

## 2022-01-30 ENCOUNTER — Other Ambulatory Visit: Payer: BC Managed Care – PPO

## 2022-01-30 DIAGNOSIS — C61 Malignant neoplasm of prostate: Secondary | ICD-10-CM

## 2022-01-31 LAB — PSA: Prostate Specific Ag, Serum: <0.1 ng/mL (ref 0.0–4.0)

## 2022-05-18 ENCOUNTER — Other Ambulatory Visit: Payer: BC Managed Care – PPO

## 2022-05-18 DIAGNOSIS — C61 Malignant neoplasm of prostate: Secondary | ICD-10-CM

## 2022-05-19 LAB — PSA: Prostate Specific Ag, Serum: <0.1 ng/mL (ref 0.0–4.0)

## 2022-05-24 ENCOUNTER — Ambulatory Visit (INDEPENDENT_AMBULATORY_CARE_PROVIDER_SITE_OTHER): Payer: BC Managed Care – PPO | Admitting: Urology

## 2022-05-24 ENCOUNTER — Encounter: Payer: Self-pay | Admitting: Urology

## 2022-05-24 VITALS — BP 159/94 | HR 82 | Ht 71.0 in | Wt 249.0 lb

## 2022-05-24 DIAGNOSIS — N393 Stress incontinence (female) (male): Secondary | ICD-10-CM

## 2022-05-24 DIAGNOSIS — Z8546 Personal history of malignant neoplasm of prostate: Secondary | ICD-10-CM

## 2022-05-24 DIAGNOSIS — N529 Male erectile dysfunction, unspecified: Secondary | ICD-10-CM

## 2022-05-24 DIAGNOSIS — C61 Malignant neoplasm of prostate: Secondary | ICD-10-CM

## 2022-05-24 MED ORDER — TADALAFIL 20 MG PO TABS: 20.0000 mg | ORAL_TABLET | Freq: Every day | ORAL | 3 refills | Status: DC | PRN

## 2022-05-24 NOTE — Progress Notes (Signed)
   05/24/2022 10:20 AM   Francis Monroe [birth year] [id number]  Reason for visit: Follow up prostate cancer, stress incontinence, ED   HPI: 61 year old HIV-positive African-American male who underwent robotic radical prostatectomy and pelvic lymph node dissection on 04/25/2021 for prostate cancer with final pathology showing Gleason score 5+4 = 9 high risk prostate cancer with intraductal carcinoma present, margins were all negative, and lymph nodes were negative.  Only a right-sided nerve spare was performed with his high volume disease on the left side.  Pretreatment PSA was 6.3.   Initial post-op PSA on 06/16/2021 was undetectable, and PSA remains undetectable on most recent PSA 05/18/2022.   We discussed his high risk of recurrence based on his pathology and presence of intraductal carcinoma, and the need for close monitoring of the PSA moving forward.   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  He does not leak when he sitting or overnight.  He was prescribed Sudafed by an outside urologist which he thinks has helped somewhat with the leakage, he is using 1 depends per day and sometimes changing a pad.  He was unable to participate in pelvic floor physical therapy secondary to insurance and cost issues.  He previously tried a Cunningham clamp which was uncomfortable, as well as a condom catheter without any significant improvement.  Has been on Cialis 5 mg daily in the past without any improvement in erections, but has not taken that for the last few months.  We had another long conversation with the patient about postop expectations after prostatectomy, especially with his high risk disease and need for only partial nerve sparing.  I think it is worth a trial of 20 mg Cialis daily prior to considering penile injections or penile prosthesis.  He would like to wait until 18 months postop before considering penile  injections, penile prosthesis, or artificial urinary sphincter.  Extensive patient information provided again today about penile prosthesis or sphincter, and I again offered him referral to a provider in either [place] or [facility] that performs those procedures.  -Trial of Cialis 20 mg daily for ED -RTC 6 months PSA prior -Okay to refer to [facility] or [facility] for consideration of AUS or IPP, or penile injections with [name], PA  [name], [name] [address] [place], [name] [address] [phone]

## 2022-06-28 ENCOUNTER — Telehealth: Payer: Self-pay | Admitting: *Deleted

## 2022-06-28 NOTE — Telephone Encounter (Signed)
Per Sam:  I got the condom cath form for him that I was supposed to sign, but per chart review he didn't have improvement with these. can you please call him and clarify if he's even still using them? if not I can save [place] all a fax  .left message to have patient return my call.

## 2022-11-13 ENCOUNTER — Other Ambulatory Visit: Payer: BC Managed Care – PPO

## 2022-11-13 DIAGNOSIS — C61 Malignant neoplasm of prostate: Secondary | ICD-10-CM

## 2022-11-14 LAB — PSA: Prostate Specific Ag, Serum: <0.1 ng/mL (ref 0.0–4.0)

## 2022-11-15 ENCOUNTER — Ambulatory Visit (INDEPENDENT_AMBULATORY_CARE_PROVIDER_SITE_OTHER): Payer: BC Managed Care – PPO | Admitting: Urology

## 2022-11-15 ENCOUNTER — Encounter: Payer: Self-pay | Admitting: Urology

## 2022-11-15 VITALS — BP 152/99 | HR 82 | Ht 71.0 in | Wt 250.0 lb

## 2022-11-15 DIAGNOSIS — N5231 Erectile dysfunction following radical prostatectomy: Secondary | ICD-10-CM

## 2022-11-15 DIAGNOSIS — N393 Stress incontinence (female) (male): Secondary | ICD-10-CM | POA: Diagnosis not present

## 2022-11-15 DIAGNOSIS — Z8546 Personal history of malignant neoplasm of prostate: Secondary | ICD-10-CM

## 2022-11-15 DIAGNOSIS — C61 Malignant neoplasm of prostate: Secondary | ICD-10-CM

## 2022-11-15 NOTE — Progress Notes (Signed)
   11/15/2022 10:30 AM   Francis Monroe 1960-12-28 [id number]  Reason for visit: Follow up prostate cancer, stress incontinence, ED   HPI: 61 year old HIV-positive African-American male who underwent robotic radical prostatectomy and pelvic lymph node dissection on 04/25/2021 for prostate cancer with final pathology showing Gleason score 5+4=9 high risk prostate cancer with intraductal carcinoma present, margins were all negative, and lymph nodes were negative.  Only a right-sided nerve spare was performed with his high volume disease on the left side.  Pre-treatment PSA was 6.3.   Initial post-op PSA on 06/16/2021 was undetectable, and PSA remains undetectable on most recent PSA    We discussed his high risk of recurrence based on his pathology and presence of intraductal carcinoma, and the need for ongoing close monitoring of the PSA moving forward.   He is still having bothersome stress incontinence during the day.  He is requiring depends and a pad.  He does not have leakage with sitting or laying flat.  His insurance would not pay for pelvic floor physical therapy.  He tried a Cunningham clamp which was very uncomfortable as well as a condom catheter without any significant improvement.  He denies any changes or improvement in the urination in the last 6 months.  He is increasingly bothered and interested in other treatment options.  He also is having persistent erectile dysfunction since surgery despite Cialis 20 mg on demand.  Notably he was on Viagra and Cialis for ED prior to surgery.  At our last visit in July 2023 I had offered him referral to [facility] or [facility] to consider a artificial sphincter or penile prosthesis but he deferred at that time.  At this point he is interested in that referral and extensive information about artificial urinary sphincter and penile prosthesis were provided today.  -Continue PSA every 6 months, RTC lab visit 6 months PSA -Referral placed to [facility] in [place] to consider artificial urinary sphincter and penile prosthesis  [name], MD  [place] [address] [place], [place] [address] [phone]

## 2022-11-15 NOTE — Patient Instructions (Signed)
We have placed a referral to Dr. Cain Sieve at [facility]rology in [place] to discuss artificial urinary sphincter for the urinary leakage, as well as a penile implant.  Erectile Dysfunction Erectile dysfunction (ED) is the inability to get or keep an erection in order to have sexual intercourse. ED is considered a symptom of an underlying disorder and is not considered a disease. ED may include: Inability to get an erection. Lack of enough hardness of the erection to allow penetration. Loss of erection before sex is finished. What are the causes? This condition may be caused by: Physical causes, such as: Artery problems. This may include heart disease, high blood pressure, atherosclerosis, and diabetes. Hormonal problems, such as low testosterone. Obesity. Nerve problems. This may include back or pelvic injuries, multiple sclerosis, Parkinson's disease, spinal cord injury, and stroke. Certain medicines, such as: Pain relievers. Antidepressants. Blood pressure medicines and water pills (diuretics). Cancer medicines. Antihistamines. Muscle relaxants. Lifestyle factors, such as: Use of drugs such as marijuana, cocaine, or opioids. Excessive use of alcohol. Smoking. Lack of physical activity or exercise. Psychological causes, such as: Anxiety or stress. Sadness or depression. Exhaustion. Fear about sexual performance. Guilt. What are the signs or symptoms? Symptoms of this condition include: Inability to get an erection. Lack of enough hardness of the erection to allow penetration. Loss of the erection before sex is finished. Sometimes having normal erections, but with frequent unsatisfactory episodes. Low sexual satisfaction in either partner due to erection problems. A curved penis occurring with erection. The curve may cause pain, or the penis may be too curved to allow for intercourse. Never having nighttime or morning erections. How is this diagnosed? This condition is  often diagnosed by: Performing a physical exam to find other diseases or specific problems with the penis. Asking you detailed questions about the problem. Doing tests, such as: Blood tests to check for diabetes mellitus or high cholesterol, or to measure hormone levels. Other tests to check for underlying health conditions. An ultrasound exam to check for scarring. A test to check blood flow to the penis. Doing a sleep study at home to measure nighttime erections. How is this treated? This condition may be treated by: Medicines, such as: Medicine taken by mouth to help you achieve an erection (oral medicine). Hormone replacement therapy to replace low testosterone levels. Medicine that is injected into the penis. Your health care provider may instruct you how to give yourself these injections at home. Medicine that is delivered with a short applicator tube. The tube is inserted into the opening at the tip of the penis, which is the opening of the urethra. A tiny pellet of medicine is put in the urethra. The pellet dissolves and enhances erectile function. This is also called MUSE (medicated urethral system for erections) therapy. Vacuum pump. This is a pump with a ring on it. The pump and ring are placed on the penis and used to create pressure that helps the penis become erect. Penile implant surgery. In this procedure, you may receive: An inflatable implant. This consists of cylinders, a pump, and a reservoir. The cylinders can be inflated with a fluid that helps to create an erection, and they can be deflated after intercourse. A semi-rigid implant. This consists of two silicone rubber rods. The rods provide some rigidity. They are also flexible, so the penis can both curve downward in its normal position and become straight for sexual intercourse. Blood vessel surgery to improve blood flow to the penis. During this procedure,  a blood vessel from a different part of the body is placed into  the penis to allow blood to flow around (bypass) damaged or blocked blood vessels. Lifestyle changes, such as exercising more, losing weight, and quitting smoking. Follow these instructions at home: Medicines  Take over-the-counter and prescription medicines only as told by your health care provider. Do not increase the dosage without first discussing it with your health care provider. If you are using self-injections, do injections as directed by your health care provider. Make sure you avoid any veins that are on the surface of the penis. After giving an injection, apply pressure to the injection site for 5 minutes. Talk to your health care provider about how to prevent headaches while taking ED medicines. These medicines may cause a sudden headache due to the increase in blood flow in your body. General instructions Exercise regularly, as directed by your health care provider. Work with your health care provider to lose weight, if needed. Do not use any products that contain nicotine or tobacco. These products include cigarettes, chewing tobacco, and vaping devices, such as e-cigarettes. If you need help quitting, ask your health care provider. Before using a vacuum pump, read the instructions that come with the pump and discuss any questions with your health care provider. Keep all follow-up visits. This is important. Contact a health care provider if: You feel nauseous. You are vomiting. You get sudden headaches while taking ED medicines. You have any concerns about your sexual health. Get help right away if: You are taking oral or injectable medicines and you have an erection that lasts longer than 4 hours. If your health care provider is unavailable, go to the nearest emergency room for evaluation. An erection that lasts much longer than 4 hours can result in permanent damage to your penis. You have severe pain in your groin or abdomen. You develop redness or severe swelling of your  penis. You have redness spreading at your groin or lower abdomen. You are unable to urinate. You experience chest pain or a rapid heartbeat (palpitations) after taking oral medicines. These symptoms may represent a serious problem that is an emergency. Do not wait to see if the symptoms will go away. Get medical help right away. Call your local emergency services (911 in the [place] Do not drive yourself to the hospital. Summary Erectile dysfunction (ED) is the inability to get or keep an erection during sexual intercourse. This condition is diagnosed based on a physical exam, your symptoms, and tests to determine the cause. Treatment varies depending on the cause and may include medicines, hormone therapy, surgery, or a vacuum pump. You may need follow-up visits to make sure that you are using your medicines or devices correctly. Get help right away if you are taking or injecting medicines and you have an erection that lasts longer than 4 hours. This information is not intended to replace advice given to you by your health care provider. Make sure you discuss any questions you have with your health care provider. Document Revised: 01/19/2021 Document Reviewed: 01/19/2021 Elsevier Patient Education  [employer].

## 2022-11-16 ENCOUNTER — Telehealth: Payer: Self-pay | Admitting: Urology

## 2022-11-16 NOTE — Telephone Encounter (Signed)
Pt called requesting to speak w Guerry Minors  as soon as you can  [phone]  Thank you

## 2022-12-06 ENCOUNTER — Other Ambulatory Visit: Payer: Self-pay | Admitting: Urology

## 2022-12-26 NOTE — Progress Notes (Addendum)
PCP - Larene Beach Coward,NP clearance 12-14-22 on chart Cardiologist - no  PPM/ICD -  Device Orders -  Rep Notified -   Chest x-ray -  EKG - 12-28-22 Stress Test -  ECHO - 2019 Cardiac Cath -   Sleep Study -  CPAP -   Fasting Blood Sugar -  Checks Blood Sugar _____ times a day  Blood Thinner Instructions: Aspirin Instructions:  ERAS Protcol -N/A PRE-SURGERY    COVID vaccine -yes  Activity--Able to climb a flight of stairs without SOB or CP Anesthesia review: HTN,HIV  Patient denies shortness of breath, fever, cough and chest pain at PAT appointment   All instructions explained to the patient, with a verbal understanding of the material. Patient agrees to go over the instructions while at home for a better understanding. Patient also instructed to self quarantine after being tested for COVID-19. The opportunity to ask questions was provided.

## 2022-12-26 NOTE — Patient Instructions (Signed)
SURGICAL WAITING ROOM VISITATION  Patients having surgery or a procedure may have no more than 2 support people in the waiting area - these visitors may rotate.    Children under the age of 51 must have an adult with them who is not the patient.  Due to an increase in RSV and influenza rates and associated hospitalizations, children ages 42 and under may not visit patients in [place].  If the patient needs to stay at the hospital during part of their recovery, the visitor guidelines for inpatient rooms apply. Pre-op nurse will coordinate an appropriate time for 1 support person to accompany patient in pre-op.  This support person may not rotate.    Please refer to the [facility] website for the visitor guidelines for Inpatients (after your surgery is over and you are in a regular room).       Your procedure is scheduled on: 01-10-23   Report to [facility] Main Entrance    Report to admitting at      [place]   AM   Call this number if you have problems the morning of surgery [phone]   Do not eat food or drink liquids  :After Midnight.             If you have questions, please contact your surgeon's office.   FOLLOW ANY ADDITIONAL PRE OP INSTRUCTIONS YOU RECEIVED FROM YOUR SURGEON'S OFFICE!!!     Oral Hygiene is also important to reduce your risk of infection.                                    Remember - BRUSH YOUR TEETH THE MORNING OF SURGERY WITH YOUR REGULAR TOOTHPASTE  DENTURES WILL BE REMOVED PRIOR TO SURGERY PLEASE DO NOT APPLY "Poly grip" OR ADHESIVES!!!   Do NOT smoke after Midnight   Take these medicines the morning of surgery with A SIP OF WATER: tramadol if needed    Bring CPAP mask and tubing day of surgery.                              You may not have any metal on your body including hair pins, jewelry, and body piercing             Do not  lotions, powders, perfumes/cologne, or deodorant               Men may shave face and  neck.   Do not bring valuables to the hospital. [place].   Contacts, glasses, dentures or bridgework may not be worn into surgery.   Bring small overnight bag day of surgery.   DO NOT [place]. PHARMACY WILL DISPENSE MEDICATIONS LISTED ON YOUR MEDICATION LIST TO YOU DURING YOUR ADMISSION [place]!    Patients discharged on the day of surgery will not be allowed to drive home.  Someone NEEDS to stay with you for the first 24 hours after anesthesia.   Special Instructions: Bring a copy of your healthcare power of attorney and living will documents the day of surgery if you haven't scanned them before.              Please read over the following  fact sheets you were given: IF YOU HAVE QUESTIONS ABOUT YOUR PRE-OP INSTRUCTIONS PLEASE CALL [phone]   If you received a COVID test during your pre-op visit  it is requested that you wear a mask when out in public, stay away from anyone that may not be feeling well and notify your surgeon if you develop symptoms. If you test positive for Covid or have been in contact with anyone that has tested positive in the last 10 days please notify you surgeon.    [place] - Preparing for Surgery Before surgery, you can play an important role.  Because skin is not sterile, your skin needs to be as free of germs as possible.  You can reduce the number of germs on your skin by washing with CHG (chlorahexidine gluconate) soap before surgery.  CHG is an antiseptic cleaner which kills germs and bonds with the skin to continue killing germs even after washing. Please DO NOT use if you have an allergy to CHG or antibacterial soaps.  If your skin becomes reddened/irritated stop using the CHG and inform your nurse when you arrive at Short Stay. Do not shave (including legs and underarms) for at least 48 hours prior to the first CHG shower.  You may shave your  face/neck. Please follow these instructions carefully:  1.  Shower with CHG Soap the night before surgery and the  morning of Surgery.  2.  If you choose to wash your hair, wash your hair first as usual with your  normal  shampoo.  3.  After you shampoo, rinse your hair and body thoroughly to remove the  shampoo.                           4.  Use CHG as you would any other liquid soap.  You can apply chg directly  to the skin and wash                       Gently with a scrungie or clean washcloth.  5.  Apply the CHG Soap to your body ONLY FROM THE NECK DOWN.   Do not use on face/ open                           Wound or open sores. Avoid contact with eyes, ears mouth and genitals (private parts).                       Wash face,  Genitals (private parts) with your normal soap.             6.  Wash thoroughly, paying special attention to the area where your surgery  will be performed.  7.  Thoroughly rinse your body with warm water from the neck down.  8.  DO NOT shower/wash with your normal soap after using and rinsing off  the CHG Soap.                9.  Pat yourself dry with a clean towel.            10.  Wear clean pajamas.            11.  Place clean sheets on your bed the night of your first shower and do not  sleep with pets. Day of Surgery : Do not apply any lotions/deodorants the morning of surgery.  Please wear clean clothes to the hospital/surgery center.  FAILURE TO FOLLOW THESE INSTRUCTIONS MAY RESULT IN THE CANCELLATION OF YOUR SURGERY PATIENT SIGNATURE_________________________________  NURSE SIGNATURE__________________________________  ________________________________________________________________________

## 2022-12-28 ENCOUNTER — Encounter (HOSPITAL_COMMUNITY): Payer: Self-pay

## 2022-12-28 ENCOUNTER — Other Ambulatory Visit: Payer: Self-pay

## 2022-12-28 ENCOUNTER — Encounter (HOSPITAL_COMMUNITY)
Admission: RE | Admit: 2022-12-28 | Discharge: 2022-12-28 | Disposition: A | Discharge status: Home / Self Care | Payer: BC Managed Care – PPO | Source: Ambulatory Visit | Attending: Urology | Admitting: Urology

## 2022-12-28 VITALS — HR 74 | Temp 98.2°F | Resp 16 | Ht 71.0 in | Wt 255.0 lb

## 2022-12-28 DIAGNOSIS — Z01818 Encounter for other preprocedural examination: Secondary | ICD-10-CM | POA: Insufficient documentation

## 2022-12-28 DIAGNOSIS — I10 Essential (primary) hypertension: Secondary | ICD-10-CM | POA: Diagnosis not present

## 2022-12-28 HISTORY — DX: Human immunodeficiency virus (HIV) disease: B20

## 2022-12-28 HISTORY — DX: Essential (primary) hypertension: I10

## 2022-12-28 LAB — CBC
HCT: 45.1 % (ref 39.0–52.0)
Hemoglobin: 15 g/dL (ref 13.0–17.0)
MCH: 30 pg (ref 26.0–34.0)
MCHC: 33.3 g/dL (ref 30.0–36.0)
MCV: 90.2 fL (ref 80.0–100.0)
Platelets: 231 10*3/uL (ref 150–400)
RBC: 5 MIL/uL (ref 4.22–5.81)
RDW: 14.5 % (ref 11.5–15.5)
WBC: 4.9 10*3/uL (ref 4.0–10.5)
nRBC: 0 % (ref 0.0–0.2)

## 2022-12-28 LAB — BASIC METABOLIC PANEL
Anion gap: 9 (ref 5–15)
BUN: 13 mg/dL (ref 8–23)
CO2: 27 mmol/L (ref 22–32)
Calcium: 9.2 mg/dL (ref 8.9–10.3)
Chloride: 103 mmol/L (ref 98–111)
Creatinine, Ser: 1.06 mg/dL (ref 0.61–1.24)
GFR, Estimated: >60 mL/min (ref >60)
Glucose, Bld: 107 mg/dL — ABNORMAL HIGH (ref 70–99)
Potassium: 4 mmol/L (ref 3.5–5.1)
Sodium: 139 mmol/L (ref 135–145)

## 2022-12-29 LAB — URINE CULTURE: Culture: NO GROWTH

## 2023-01-09 MED ORDER — GENTAMICIN SULFATE 40 MG/ML IJ SOLN
5.0000 mg/kg | INTRAVENOUS | Status: AC
  Administered 2023-01-10: 455.2 mg via INTRAVENOUS
  Filled 2023-01-09: qty 11.5

## 2023-01-09 NOTE — Anesthesia Preprocedure Evaluation (Signed)
Anesthesia Evaluation  Patient identified by MRN, date of birth, ID band Patient awake    Reviewed: Allergy & Precautions, NPO status , Patient's Chart, lab work & pertinent test results  Airway Mallampati: II  TM Distance: >3 FB Neck ROM: Full    Dental no notable dental hx.    Pulmonary neg pulmonary ROS   Pulmonary exam normal        Cardiovascular hypertension, Pt. on medications Normal cardiovascular exam     Neuro/Psych negative neurological ROS  negative psych ROS   GI/Hepatic negative GI ROS, Neg liver ROS,,,  Endo/Other  negative endocrine ROS    Renal/GU negative Renal ROS     Musculoskeletal  (+) Arthritis ,    Abdominal  (+) + obese  Peds  Hematology  (+) HIV  Anesthesia Other Findings STRESS INCONTINENCE AFTER PROSTATECTOMY  Reproductive/Obstetrics                             Anesthesia Physical Anesthesia Plan  ASA: 2  Anesthesia Plan: General   Post-op Pain Management:    Induction: Intravenous  PONV Risk Score and Plan: 3 and Treatment may vary due to age or medical condition, Ondansetron, Dexamethasone and Midazolam  Airway Management Planned: Oral ETT  Additional Equipment:   Intra-op Plan:   Post-operative Plan: Extubation in OR  Informed Consent: I have reviewed the patients History and Physical, chart, labs and discussed the procedure including the risks, benefits and alternatives for the proposed anesthesia with the patient or authorized representative who has indicated his/her understanding and acceptance.     Dental advisory given  Plan Discussed with: CRNA  Anesthesia Plan Comments:         Anesthesia Quick Evaluation

## 2023-01-10 ENCOUNTER — Other Ambulatory Visit: Payer: Self-pay

## 2023-01-10 ENCOUNTER — Observation Stay (HOSPITAL_COMMUNITY)
Admission: RE | Admit: 2023-01-10 | Discharge: 2023-01-11 | Disposition: A | Discharge status: Home / Self Care | Payer: BC Managed Care – PPO | Source: Ambulatory Visit | Attending: Urology | Admitting: Urology

## 2023-01-10 ENCOUNTER — Encounter (HOSPITAL_COMMUNITY)
Admission: RE | Disposition: A | Discharge status: Home / Self Care | Payer: Self-pay | Source: Ambulatory Visit | Attending: Urology

## 2023-01-10 ENCOUNTER — Ambulatory Visit (HOSPITAL_COMMUNITY): Payer: BC Managed Care – PPO | Admitting: Anesthesiology

## 2023-01-10 ENCOUNTER — Encounter (HOSPITAL_COMMUNITY): Payer: Self-pay | Admitting: Urology

## 2023-01-10 DIAGNOSIS — N393 Stress incontinence (female) (male): Principal | ICD-10-CM | POA: Insufficient documentation

## 2023-01-10 DIAGNOSIS — Z8546 Personal history of malignant neoplasm of prostate: Secondary | ICD-10-CM | POA: Insufficient documentation

## 2023-01-10 DIAGNOSIS — B20 Human immunodeficiency virus [HIV] disease: Secondary | ICD-10-CM | POA: Insufficient documentation

## 2023-01-10 DIAGNOSIS — Z21 Asymptomatic human immunodeficiency virus [HIV] infection status: Secondary | ICD-10-CM | POA: Insufficient documentation

## 2023-01-10 DIAGNOSIS — I10 Essential (primary) hypertension: Secondary | ICD-10-CM | POA: Insufficient documentation

## 2023-01-10 DIAGNOSIS — Z79899 Other long term (current) drug therapy: Secondary | ICD-10-CM | POA: Diagnosis not present

## 2023-01-10 DIAGNOSIS — G8918 Other acute postprocedural pain: Secondary | ICD-10-CM | POA: Diagnosis present

## 2023-01-10 HISTORY — PX: URETHRAL SLING: SHX2621

## 2023-01-10 SURGERY — CREATION, URETHRAL SLING, MALE
Anesthesia: General

## 2023-01-10 MED ORDER — DIPHENHYDRAMINE HCL 50 MG/ML IJ SOLN: 12.5000 mg | Freq: Four times a day (QID) | INTRAMUSCULAR | Status: DC | PRN

## 2023-01-10 MED ORDER — PHENYLEPHRINE 80 MCG/ML (10ML) SYRINGE FOR IV PUSH (FOR BLOOD PRESSURE SUPPORT)
PREFILLED_SYRINGE | INTRAVENOUS | Status: AC
  Filled 2023-01-10: qty 10

## 2023-01-10 MED ORDER — GABAPENTIN 300 MG PO CAPS: 300.0000 mg | ORAL_CAPSULE | Freq: Every evening | ORAL | Status: DC | PRN

## 2023-01-10 MED ORDER — MIDAZOLAM HCL 5 MG/5ML IJ SOLN
INTRAMUSCULAR | Status: DC | PRN
  Administered 2023-01-10: 2 mg via INTRAVENOUS

## 2023-01-10 MED ORDER — TIZANIDINE HCL 4 MG PO TABS: 4.0000 mg | ORAL_TABLET | Freq: Every evening | ORAL | Status: DC | PRN

## 2023-01-10 MED ORDER — CELECOXIB 200 MG PO CAPS
200.0000 mg | ORAL_CAPSULE | Freq: Two times a day (BID) | ORAL | Status: DC
  Administered 2023-01-10 – 2023-01-11 (×3): 200 mg via ORAL
  Filled 2023-01-10 (×3): qty 1

## 2023-01-10 MED ORDER — FENTANYL CITRATE (PF) 100 MCG/2ML IJ SOLN
INTRAMUSCULAR | Status: AC
  Filled 2023-01-10: qty 2

## 2023-01-10 MED ORDER — STERILE WATER FOR IRRIGATION IR SOLN
Status: DC | PRN
  Administered 2023-01-10: 1000 mL

## 2023-01-10 MED ORDER — ONDANSETRON HCL 4 MG/2ML IJ SOLN
INTRAMUSCULAR | Status: DC | PRN
  Administered 2023-01-10: 4 mg via INTRAVENOUS

## 2023-01-10 MED ORDER — HYDROMORPHONE HCL 1 MG/ML IJ SOLN: 0.5000 mg | INTRAMUSCULAR | Status: DC | PRN

## 2023-01-10 MED ORDER — MIDAZOLAM HCL 2 MG/2ML IJ SOLN
INTRAMUSCULAR | Status: AC
  Filled 2023-01-10: qty 2

## 2023-01-10 MED ORDER — CHLORHEXIDINE GLUCONATE 0.12 % MT SOLN
15.0000 mL | Freq: Once | OROMUCOSAL | Status: AC
  Administered 2023-01-10: 15 mL via OROMUCOSAL

## 2023-01-10 MED ORDER — LIDOCAINE HCL (PF) 1 % IJ SOLN
INTRAMUSCULAR | Status: AC
  Filled 2023-01-10: qty 30

## 2023-01-10 MED ORDER — PHENYLEPHRINE HCL (PRESSORS) 10 MG/ML IV SOLN
INTRAVENOUS | Status: DC | PRN
  Administered 2023-01-10 (×3): 160 ug via INTRAVENOUS
  Administered 2023-01-10: 80 ug via INTRAVENOUS
  Administered 2023-01-10 (×6): 160 ug via INTRAVENOUS
  Administered 2023-01-10: 80 ug via INTRAVENOUS
  Administered 2023-01-10: 160 ug via INTRAVENOUS

## 2023-01-10 MED ORDER — SUGAMMADEX SODIUM 500 MG/5ML IV SOLN
INTRAVENOUS | Status: DC | PRN
  Administered 2023-01-10: 400 mg via INTRAVENOUS

## 2023-01-10 MED ORDER — CHLORHEXIDINE GLUCONATE 4 % EX LIQD: Freq: Once | CUTANEOUS | Status: DC

## 2023-01-10 MED ORDER — DIPHENHYDRAMINE HCL 12.5 MG/5ML PO ELIX: 12.5000 mg | ORAL_SOLUTION | Freq: Four times a day (QID) | ORAL | Status: DC | PRN

## 2023-01-10 MED ORDER — PROMETHAZINE HCL 25 MG/ML IJ SOLN: 6.2500 mg | INTRAMUSCULAR | Status: DC | PRN

## 2023-01-10 MED ORDER — BUPIVACAINE HCL (PF) 0.5 % IJ SOLN
INTRAMUSCULAR | Status: DC | PRN
  Administered 2023-01-10: 30 mL

## 2023-01-10 MED ORDER — VANCOMYCIN HCL 1500 MG/300ML IV SOLN
1500.0000 mg | INTRAVENOUS | Status: AC
  Administered 2023-01-10: 1500 mg via INTRAVENOUS
  Filled 2023-01-10: qty 300

## 2023-01-10 MED ORDER — ONDANSETRON HCL 4 MG/2ML IJ SOLN
INTRAMUSCULAR | Status: AC
  Filled 2023-01-10: qty 2

## 2023-01-10 MED ORDER — ROCURONIUM BROMIDE 100 MG/10ML IV SOLN
INTRAVENOUS | Status: DC | PRN
  Administered 2023-01-10: 20 mg via INTRAVENOUS
  Administered 2023-01-10: 40 mg via INTRAVENOUS
  Administered 2023-01-10: 20 mg via INTRAVENOUS

## 2023-01-10 MED ORDER — ACETAMINOPHEN 500 MG PO TABS
1000.0000 mg | ORAL_TABLET | Freq: Once | ORAL | Status: AC
  Administered 2023-01-10: 1000 mg via ORAL
  Filled 2023-01-10: qty 2

## 2023-01-10 MED ORDER — ATORVASTATIN CALCIUM 10 MG PO TABS
10.0000 mg | ORAL_TABLET | Freq: Every day | ORAL | Status: DC
  Administered 2023-01-10: 10 mg via ORAL
  Filled 2023-01-10: qty 1

## 2023-01-10 MED ORDER — LIDOCAINE HCL (PF) 2 % IJ SOLN
INTRAMUSCULAR | Status: AC
  Filled 2023-01-10: qty 5

## 2023-01-10 MED ORDER — 0.9 % SODIUM CHLORIDE (POUR BTL) OPTIME
TOPICAL | Status: DC | PRN
  Administered 2023-01-10: 1000 mL

## 2023-01-10 MED ORDER — FENTANYL CITRATE (PF) 100 MCG/2ML IJ SOLN
INTRAMUSCULAR | Status: DC | PRN
  Administered 2023-01-10 (×2): 50 ug via INTRAVENOUS
  Administered 2023-01-10: 100 ug via INTRAVENOUS

## 2023-01-10 MED ORDER — PROPOFOL 10 MG/ML IV BOLUS
INTRAVENOUS | Status: AC
  Filled 2023-01-10: qty 20

## 2023-01-10 MED ORDER — OXYCODONE HCL 5 MG/5ML PO SOLN: 5.0000 mg | Freq: Once | ORAL | Status: AC | PRN

## 2023-01-10 MED ORDER — ACETAMINOPHEN 500 MG PO TABS
1000.0000 mg | ORAL_TABLET | Freq: Four times a day (QID) | ORAL | Status: DC
  Administered 2023-01-10 – 2023-01-11 (×3): 1000 mg via ORAL
  Filled 2023-01-10 (×4): qty 2

## 2023-01-10 MED ORDER — TRAMADOL HCL 50 MG PO TABS: 50.0000 mg | ORAL_TABLET | Freq: Four times a day (QID) | ORAL | Status: DC | PRN

## 2023-01-10 MED ORDER — KETOROLAC TROMETHAMINE 30 MG/ML IJ SOLN: 30.0000 mg | Freq: Once | INTRAMUSCULAR | Status: DC | PRN

## 2023-01-10 MED ORDER — ONDANSETRON HCL 4 MG/2ML IJ SOLN: 4.0000 mg | INTRAMUSCULAR | Status: DC | PRN

## 2023-01-10 MED ORDER — DOLUTEGRAVIR-LAMIVUDINE 50-300 MG PO TABS
1.0000 | ORAL_TABLET | Freq: Every day | ORAL | Status: DC
  Administered 2023-01-10: 1 via ORAL
  Filled 2023-01-10: qty 1

## 2023-01-10 MED ORDER — FENTANYL CITRATE PF 50 MCG/ML IJ SOSY: 25.0000 ug | PREFILLED_SYRINGE | INTRAMUSCULAR | Status: DC | PRN

## 2023-01-10 MED ORDER — DEXAMETHASONE SODIUM PHOSPHATE 10 MG/ML IJ SOLN
INTRAMUSCULAR | Status: AC
  Filled 2023-01-10: qty 1

## 2023-01-10 MED ORDER — OXYCODONE HCL 5 MG PO TABS
ORAL_TABLET | ORAL | Status: AC
  Filled 2023-01-10: qty 1

## 2023-01-10 MED ORDER — AMLODIPINE-ATORVASTATIN 5-10 MG PO TABS: 1.0000 | ORAL_TABLET | Freq: Every day | ORAL | Status: DC

## 2023-01-10 MED ORDER — SODIUM CHLORIDE 0.45 % IV SOLN: INTRAVENOUS | Status: DC

## 2023-01-10 MED ORDER — AMISULPRIDE (ANTIEMETIC) 5 MG/2ML IV SOLN: 10.0000 mg | Freq: Once | INTRAVENOUS | Status: DC | PRN

## 2023-01-10 MED ORDER — DEXAMETHASONE SODIUM PHOSPHATE 10 MG/ML IJ SOLN
INTRAMUSCULAR | Status: DC | PRN
  Administered 2023-01-10: 10 mg via INTRAVENOUS

## 2023-01-10 MED ORDER — ORAL CARE MOUTH RINSE: 15.0000 mL | Freq: Once | OROMUCOSAL | Status: AC

## 2023-01-10 MED ORDER — LIDOCAINE HCL (CARDIAC) PF 100 MG/5ML IV SOSY
PREFILLED_SYRINGE | INTRAVENOUS | Status: DC | PRN
  Administered 2023-01-10: 60 mg via INTRAVENOUS

## 2023-01-10 MED ORDER — BUPIVACAINE HCL (PF) 0.5 % IJ SOLN
INTRAMUSCULAR | Status: AC
  Filled 2023-01-10: qty 30

## 2023-01-10 MED ORDER — LACTATED RINGERS IV SOLN: INTRAVENOUS | Status: DC

## 2023-01-10 MED ORDER — LIDOCAINE HCL (PF) 1 % IJ SOLN
INTRAMUSCULAR | Status: DC | PRN
  Administered 2023-01-10: 30 mL

## 2023-01-10 MED ORDER — PROPOFOL 10 MG/ML IV BOLUS
INTRAVENOUS | Status: DC | PRN
  Administered 2023-01-10: 150 mg via INTRAVENOUS

## 2023-01-10 MED ORDER — AMLODIPINE BESYLATE 5 MG PO TABS
5.0000 mg | ORAL_TABLET | Freq: Every day | ORAL | Status: DC
  Administered 2023-01-10: 5 mg via ORAL
  Filled 2023-01-10: qty 1

## 2023-01-10 MED ORDER — SULFAMETHOXAZOLE-TRIMETHOPRIM 800-160 MG PO TABS
1.0000 | ORAL_TABLET | Freq: Two times a day (BID) | ORAL | Status: DC
  Administered 2023-01-10 – 2023-01-11 (×3): 1 via ORAL
  Filled 2023-01-10 (×3): qty 1

## 2023-01-10 MED ORDER — OXYCODONE HCL 5 MG PO TABS
5.0000 mg | ORAL_TABLET | Freq: Once | ORAL | Status: AC | PRN
  Administered 2023-01-10: 5 mg via ORAL

## 2023-01-10 SURGICAL SUPPLY — 61 items
ADH SKN CLS APL DERMABOND .7 (GAUZE/BANDAGES/DRESSINGS) ×2
APL PRP STRL LF DISP 70% ISPRP (MISCELLANEOUS) ×2
BAG COUNTER SPONGE SURGICOUNT (BAG) IMPLANT
BAG DRN RND TRDRP ANRFLXCHMBR (UROLOGICAL SUPPLIES) ×1
BAG SPNG CNTER NS LX DISP (BAG)
BAG URINE DRAIN 2000ML AR STRL (UROLOGICAL SUPPLIES) ×1 IMPLANT
BLADE SURG 15 STRL LF DISP TIS (BLADE) ×1 IMPLANT
BLADE SURG 15 STRL SS (BLADE) ×1
BNDG GAUZE DERMACEA FLUFF 4 (GAUZE/BANDAGES/DRESSINGS) ×1 IMPLANT
BNDG GZE DERMACEA 4 6PLY (GAUZE/BANDAGES/DRESSINGS) ×1
BRIEF MESH DISP LRG (UNDERPADS AND DIAPERS) ×1 IMPLANT
CATH FOLEY 2WAY SLVR  5CC 14FR (CATHETERS) ×1
CATH FOLEY 2WAY SLVR 5CC 14FR (CATHETERS) ×1 IMPLANT
CATH TIEMANN FOLEY 18FR 5CC (CATHETERS) IMPLANT
CHLORAPREP W/TINT 26 (MISCELLANEOUS) ×2 IMPLANT
CNTNR URN SCR LID CUP LEK RST (MISCELLANEOUS) IMPLANT
CONT SPEC 4OZ STRL OR WHT (MISCELLANEOUS) ×1
COVER BACK TABLE 60X90IN (DRAPES) ×1 IMPLANT
COVER MAYO STAND STRL (DRAPES) ×1 IMPLANT
COVER SURGICAL LIGHT HANDLE (MISCELLANEOUS) ×1 IMPLANT
DERMABOND ADVANCED .7 DNX12 (GAUZE/BANDAGES/DRESSINGS) ×2 IMPLANT
DRAPE UNDERBUTTOCKS STRL (DISPOSABLE) ×1 IMPLANT
DRSG TELFA 3X8 NADH STRL (GAUZE/BANDAGES/DRESSINGS) ×1 IMPLANT
ELECT PENCIL ROCKER SW 15FT (MISCELLANEOUS) ×1 IMPLANT
GAUZE 4X4 16PLY ~~LOC~~+RFID DBL (SPONGE) ×2 IMPLANT
GLOVE BIOGEL M 7.0 STRL (GLOVE) ×1 IMPLANT
GLOVE BIOGEL PI IND STRL 7.0 (GLOVE) ×1 IMPLANT
GOWN STRL REUS W/ TWL XL LVL3 (GOWN DISPOSABLE) ×1 IMPLANT
GOWN STRL REUS W/TWL XL LVL3 (GOWN DISPOSABLE) ×1
HEMOSTAT ARISTA ABSORB 3G PWDR (HEMOSTASIS) IMPLANT
HOLDER FOLEY CATH W/STRAP (MISCELLANEOUS) ×1 IMPLANT
KIT BASIN OR (CUSTOM PROCEDURE TRAY) ×1 IMPLANT
LUBRICANT JELLY K Y 4OZ (MISCELLANEOUS) ×1 IMPLANT
NDL HYPO 22X1.5 SAFETY MO (MISCELLANEOUS) ×1 IMPLANT
NDL SPNL 22GX3.5 QUINCKE BK (NEEDLE) ×1 IMPLANT
NEEDLE HYPO 22X1.5 SAFETY MO (MISCELLANEOUS) ×1 IMPLANT
NEEDLE SAFETY HYPO 22GAX1.5 (MISCELLANEOUS) ×1
NEEDLE SPNL 22GX3.5 QUINCKE BK (NEEDLE) ×1 IMPLANT
PLUG CATH AND CAP STER (CATHETERS) ×1 IMPLANT
PROTECTOR NERVE ULNAR (MISCELLANEOUS) ×1 IMPLANT
SET IRRIG Y TYPE TUR BLADDER L (SET/KITS/TRAYS/PACK) IMPLANT
SHEET LAVH (DRAPES) ×1 IMPLANT
SLING ADVANCE MALE SYSTEM (Mesh General) IMPLANT
SPIKE FLUID TRANSFER (MISCELLANEOUS) IMPLANT
STAPLER VISISTAT 35W (STAPLE) ×1 IMPLANT
SUT MNCRL AB 4-0 PS2 18 (SUTURE) ×1 IMPLANT
SUT SILK 2 0 30  PSL (SUTURE)
SUT SILK 2 0 30 PSL (SUTURE) IMPLANT
SUT VIC AB 2-0 SH 27 (SUTURE)
SUT VIC AB 2-0 SH 27X BRD (SUTURE) IMPLANT
SUT VIC AB 3-0 SH 27 (SUTURE) ×5
SUT VIC AB 3-0 SH 27XBRD (SUTURE) ×4 IMPLANT
SUT VIC AB 4-0 PS2 27 (SUTURE) IMPLANT
SUT VIC AB 4-0 RB1 27 (SUTURE)
SUT VIC AB 4-0 RB1 27XBRD (SUTURE) IMPLANT
SYR 10ML LL (SYRINGE) ×1 IMPLANT
SYR 20ML LL LF (SYRINGE) ×1 IMPLANT
SYR BULB IRRIG 60ML STRL (SYRINGE) ×1 IMPLANT
TOWEL OR 17X26 10 PK STRL BLUE (TOWEL DISPOSABLE) ×2 IMPLANT
TUBING CONNECTING 10 (TUBING) ×1 IMPLANT
YANKAUER SUCT BULB TIP 10FT TU (MISCELLANEOUS) ×1 IMPLANT

## 2023-01-10 NOTE — Op Note (Signed)
Preoperative diagnosis:  1. Stress urinary incontinence 2. History of prostate cancer s/p prostatectomy  Postoperative diagnosis: same  Procedure(s): 1. Insertion of male sling (advance XP) 2. Flexible cystoscopy  Surgeon: Dr. Donald Pore  Assistant: Dr Tommie Sams  Anesthesia: General  Complications: none  EBL: 20 cc  Intraoperative findings: Good bulbar urethra displacement and urethral coaptation after placement and tightening of sling  Indication: Francis Monroe is a 62 y.o.yo M who with stress incontinence s/p prostatectomy. After an extensive discussion, he is interested in proceeding with a sling  Description of procedure:  After appropriate consent had been obtained, the patient was brought to the operative suite where anesthesia was induced. The patient was prepped and draped in the usual sterile fashion. Extra care was taken with leg positioning to minimize the risk of compartment syndrome neuropathy and deep vein thrombosis.  Preoperative antibiotics were given.  An 18 fr foley was placed and bladder drained. I an made approximately 5 cm incision in the perineum involving the lower aspect of the scrotum.  I dissected down through soft tissue and mobilized the subcutaneous tissue from the bulbospongiosus midline. A lonestar retractor was placed for good exposure and used throughout the case. I then split the muscle in the midline with my usual technique.  It was easy to see and feel the perineal tendon that was sharply taken down with Metzenbaum scissors.  There was 3-4 cm of mobility of the bulbar urethra towards the patient's head.  I was very pleased with identification and mobilization. I placed a 3-0 Vicryl where the tendon was initially attached to the bulb  I was very diligent at the beginning of the case and throughout the case to feel the abductor tendon relative to the obturator foramen.  I marked the area of entrance of the foramen needle with a marking pen.  I  used 22 French foramen needle to find the inferior rami and then through the foramen itself below the tendon.  I kept the angle appropriate with the patient in Trendelenburg.  I made a scalpel incision on the patient's left side approximately 1 cm in length.  With appropriate exposure I placed my finger in the upper aspect of the triangle on the patient's left side.  With the described technique I passed a trocar initially placing it against the buttock at 45 degree angle.  With my thumb I did a double pop maneuver through the soft tissues layers and then dropping the handle and delivering the tip onto the pulp of my index finger on the patient's left side.  The foramen needle was easily passed and with correct orientation the mesh was attached.  I double checked the location of the needle and was excellent.  I gently rotated the trocar with the mesh sling coming back through the skin  I did the exact same procedure on the right.  The mesh was attached in correct orientation.  Gently pulling on both slings and with my finger between the bulb and the mesh I gently brought the mesh close to the bulbar urethra.  The 3-0 Vicryl that was initially placed through the sponge was brought out through the distal aspect of the mesh.  I then gently synched the mesh down to the bulb.  I placed two 3-0 Vicryl's in the proximal aspect of the mesh through the bulbospongiosus.  I then passed another in the midline.  I was very happy with the orientation of the mesh.  With my finger pressing on the  mesh I gently pulled the trocars on the left and on the right and gradually sinched the bulbar urethra rotating it towards the patient's head approximately 50% of the final rotational distance.  I tensioned the sling over the catheter until it "clam-shelled". I then inserted the cystoscope to view - ultimately it needed a small bit more tensioning.  I placed my finger in the appropriate location and I could see where it was  indenting and coapting the urethra.  I gently pulled each side of the mesh slowly until the urethra occluded.  I did not pull further.  I was very happy with the coaptation of the urethra and stopped at that point  Visually and palpably there was excellent rotation of the bulbar urethra.  A 14 fr foley was then placed.  The sling was cut below each blue dot.  It had been rolled by my assistant prior to help for its release.  I irrigated.  I placed a hemostat deeply across the silicone and white sheath removing them easily.  This was done on both sides.  The mesh was cut below the skin level in the inguinal creases.  I closed each inguinal incision with 2 interrupted 4-0 Vicryl followed by Dermabond.  A 4 layer closure was used for the perineum.  I was cognizant of the scrotal aspect of the closure with 3-0 Vicryl that also included reapproximation of the bulbospongiosus muscle.  The next layer was deeper subcutaneous suture with 3-0 Vicryl.  A third layer was close to the skin with level with 3-0 Vicryl.  4-0 running mattress sutures used for the perineum.  Dermabond was applied with fluff dressing  The Foley catheter was draining well at the end of the case.  Leg position was good. I was very pleased with the surgery and hopefully it reaches the patient's treatment goal  Donald Pore MD 01/10/2023, 10:56 AM  [facility]  Pager: [phone]

## 2023-01-10 NOTE — Transfer of Care (Signed)
Immediate Anesthesia Transfer of Care Note  Patient: Theodies Rhein  Procedure(s) Performed: MALE SLING  Patient Location: PACU  Anesthesia Type:General  Level of Consciousness: awake, alert , oriented, and patient cooperative  Airway & Oxygen Therapy: Patient Spontanous Breathing and Patient connected to face mask oxygen  Post-op Assessment: Report given to RN, Post -op Vital signs reviewed and stable, and Patient moving all extremities X 4  Post vital signs: Reviewed and stable  Last Vitals:  Vitals Value Taken Time  BP 131/98 01/10/23 1100  Temp    Pulse 69 01/10/23 1102  Resp 14 01/10/23 1102  SpO2 99 % 01/10/23 1102  Vitals shown include unvalidated device data.  Last Pain:  Vitals:   01/10/23 0708  TempSrc:   PainSc: 0-No pain         Complications: No notable events documented.

## 2023-01-10 NOTE — H&P (Signed)
H&P  History of Present Illness: Francis Monroe is a 62 y.o. year old M who presents today for sling insertion. No acute complaints  Past Medical History:  Diagnosis Date   Arthritis    HIV (human immunodeficiency virus infection) ([place])    Hypertension    Prostate CA ([place])    White coat syndrome with diagnosis of hypertension    per pt. and per PCP    Past Surgical History:  Procedure Laterality Date   COLONOSCOPY     LYMPH NODE DISSECTION N/A 04/25/2021   Procedure: LYMPH NODE DISSECTION;  Surgeon: [name], MD;  Location: [facility] ORS;  Service: Urology;  Laterality: N/A;   NO PAST SURGERIES     ROBOT ASSISTED LAPAROSCOPIC RADICAL PROSTATECTOMY N/A 04/25/2021   Procedure: XI ROBOTIC ASSISTED LAPAROSCOPIC RADICAL PROSTATECTOMY;  Surgeon: [name], MD;  Location: [facility] ORS;  Service: Urology;  Laterality: N/A;   TONSILLECTOMY      Home Medications:  Current Meds  Medication Sig   amLODipine-atorvastatin (CADUET) 5-10 MG tablet Take 1 tablet by mouth at bedtime.   DOVATO 50-300 MG TABS Take 1 tablet by mouth at bedtime.   gabapentin (NEURONTIN) 300 MG capsule Take 300 mg by mouth at bedtime as needed (pain).   sodium chloride (OCEAN) 0.65 % SOLN nasal spray Place 1 spray into both nostrils as needed for congestion.   tadalafil (CIALIS) 20 MG tablet Take 1 tablet (20 mg total) by mouth daily as needed for erectile dysfunction.   tiZANidine (ZANAFLEX) 4 MG tablet Take 4 mg by mouth at bedtime as needed for muscle spasms.    Allergies:  Allergies  Allergen Reactions   Norco [Hydrocodone-Acetaminophen] Swelling    Lip swelling after receiving Norco postoperatively on 04/25/2021   Losartan Itching and Rash    Family History  Problem Relation Age of Onset   Healthy Mother    Stroke Father     Social History:  reports that he has never smoked. He has never been exposed to tobacco smoke. He has never used smokeless tobacco. He reports current alcohol use. He reports  that he does not currently use drugs.  ROS: A complete review of systems was performed.  All systems are negative except for pertinent findings as noted.  Physical Exam:  Vital signs in last 24 hours: Temp:  [98.3 F (36.8 C)] 98.3 F (36.8 C) (03/06 0649) Pulse Rate:  [83] 83 (03/06 0649) Resp:  [16] 16 (03/06 0649) BP: (147-153)/(101-102) 147/102 (03/06 0709) SpO2:  [96 %] 96 % (03/06 0649) Weight:  [115.7 kg] 115.7 kg (03/06 0708) Constitutional:  Alert and oriented, No acute distress Cardiovascular: Regular rate and rhythm Respiratory: Normal respiratory effort, Lungs clear bilaterally GI: Abdomen is soft, nontender, nondistended, no abdominal masses Lymphatic: No lymphadenopathy Neurologic: Grossly intact, no focal deficits Psychiatric: Normal mood and affect   Laboratory Data:  No results for input(s): "WBC", "HGB", "HCT", "PLT" in the last 72 hours.  No results for input(s): "NA", "K", "CL", "GLUCOSE", "BUN", "CALCIUM", "CREATININE" in the last 72 hours.  Invalid input(s): "CO3"   No results found for this or any previous visit (from the past 24 hour(s)). No results found for this or any previous visit (from the past 240 hour(s)).  Renal Function: No results for input(s): "CREATININE" in the last 168 hours. Estimated Creatinine Clearance (by C-G formula based on SCr of 1.06 mg/dL) Male: 77.1 mL/min Male: 93.5 mL/min  Radiologic Imaging: No results found.  Assessment:  Francis Monroe is  a 62 y.o. year old M with stress urinary incontinence  Plan:  To OR as planned. Procedure and risks reviewed, including but not limited to bleeding, infection, erosion, malfunction, failure to make dry, damage to adjacent structures, pain.  Donald Pore, MD 01/10/2023, 8:18 AM  [facility] Pager: [phone]

## 2023-01-10 NOTE — Anesthesia Procedure Notes (Signed)
Procedure Name: Intubation Date/Time: 01/10/2023 8:35 AM  Performed by: Jonna Munro, CRNAPre-anesthesia Checklist: Patient identified, Emergency Drugs available, Suction available, Patient being monitored and Timeout performed Patient Re-evaluated:Patient Re-evaluated prior to induction Oxygen Delivery Method: Circle system utilized Preoxygenation: Pre-oxygenation with 100% oxygen Induction Type: IV induction Ventilation: Mask ventilation without difficulty Laryngoscope Size: Mac and 4 Grade View: Grade I Tube type: Oral Tube size: 7.5 mm Number of attempts: 1 Airway Equipment and Method: Stylet Placement Confirmation: ETT inserted through vocal cords under direct vision, positive ETCO2, CO2 detector and breath sounds checked- equal and bilateral Secured at: 23 cm Tube secured with: Tape Dental Injury: Teeth and Oropharynx as per pre-operative assessment

## 2023-01-10 NOTE — Discharge Instructions (Signed)
Male Sling Post Operative Instructions Dr. Donald Pore  You may notice some swelling or black and blue bruising. This is very common, and may increase slightly over the next several days. Typically it will begin to improve 1-2 weeks after surgery You may take a shower 48 hours after surgery. Avoid submerging yourself completely in water (bath tubs, hot tubs, swimming pools, etc) until 1 month after the surgery. To clean the incision, let soapy water gently wash over the area and pat lightly. Use supportive, tight-fitting underwear for the first two weeks after surgery. For example, jock straps, sliding shorts, or briefs Apply ice packs 20 minutes on/20 minutes off for at least the first 2 days following surgery. This will help  minimize swelling and discomfort. After the first few days you can continue using ice packs if they are helpful  The skin incision is closed with sutures and purple skin glue. Both of these things dissolve on their own over the course of several weeks.   Avoid lifting anything heavier than 15 pounds for 1 month Do not put any direct pressure on the incision for long periods of time for the first 6 weeks following surgery. For example, do not ride a bicycle, motorcycle, ATV, or horse.  Sitting on a soft cushion, "donut" cushion, or pillow can help with the discomfort Avoid all sexual contact for 2 weeks following the surgery. You will be prescribed an antibiotic following the surgery; please take this as prescribed.  For pain post-operatively, you will typically be prescribed 2 medicines. The first is an anti-inflammatory medication (Celebrex, Toradol, Meloxicam). The second is narcotic pain medication (Tramadol, Oxycodone). You can take these as needed, and can supplement with over the counter tylenol. I recommend limiting the amount of narcotic pain medication you take as these medications can cause constipation and in rare cases, addiction.

## 2023-01-10 NOTE — Anesthesia Postprocedure Evaluation (Signed)
Anesthesia Post Note  Patient: [name]  Procedure(s) Performed: MALE SLING     Patient location during evaluation: PACU Anesthesia Type: General Level of consciousness: awake Pain management: pain level controlled Vital Signs Assessment: post-procedure vital signs reviewed and stable Respiratory status: spontaneous breathing, nonlabored ventilation and respiratory function stable Cardiovascular status: blood pressure returned to baseline and stable Postop Assessment: no apparent nausea or vomiting Anesthetic complications: no   No notable events documented.  Last Vitals:  Vitals:   01/10/23 1300 01/10/23 1625  BP: (!) 150/99 137/86  Pulse:  87  Resp:  18  Temp:  36.7 C  SpO2:  95%    Last Pain:  Vitals:   01/10/23 1625  TempSrc: Oral  PainSc:                  Roen Macgowan P Malvika Tung

## 2023-01-11 ENCOUNTER — Encounter (HOSPITAL_COMMUNITY): Payer: Self-pay | Admitting: Urology

## 2023-01-11 DIAGNOSIS — N393 Stress incontinence (female) (male): Secondary | ICD-10-CM | POA: Diagnosis not present

## 2023-01-11 MED ORDER — ACETAMINOPHEN 500 MG PO TABS: 1000.0000 mg | ORAL_TABLET | Freq: Four times a day (QID) | ORAL | 0 refills | Status: AC

## 2023-01-11 MED ORDER — TRAMADOL HCL 50 MG PO TABS: 50.0000 mg | ORAL_TABLET | Freq: Four times a day (QID) | ORAL | 0 refills | Status: AC | PRN

## 2023-01-11 MED ORDER — SULFAMETHOXAZOLE-TRIMETHOPRIM 800-160 MG PO TABS: 1.0000 | ORAL_TABLET | Freq: Two times a day (BID) | ORAL | 0 refills | Status: AC

## 2023-01-11 MED ORDER — CELECOXIB 200 MG PO CAPS: 200.0000 mg | ORAL_CAPSULE | Freq: Two times a day (BID) | ORAL | 1 refills | Status: AC

## 2023-01-11 NOTE — Discharge Summary (Signed)
[facility] Discharge Summary  Admit date: 01/10/2023  Discharge date and time: 01/11/23   Discharge to: Home  Discharge Service: Urology  Discharge Attending Physician:  Dr. Terrilee Files, MD  Discharge  Diagnoses: Post-op pain  Secondary Diagnosis: Principal Problem:   Post-op pain   OR Procedures: Procedure(s): MALE SLING 01/10/2023   Ancillary Procedures: None   Discharge Day Services: The patient was seen and examined by the Urology team both in the morning and immediately prior to discharge.  Vital signs and laboratory values were stable and within normal limits.  The physical exam was benign and unchanged and all surgical wounds were examined.  Discharge instructions were explained and all questions answered.  Subjective  No acute events overnight. Pain Controlled. No fever or chills.  Objective Patient Vitals for the past 8 hrs:  BP Temp Temp src Pulse Resp SpO2  01/11/23 0517 (!) 150/96 (!) 97.4 F (36.3 C) Axillary 81 18 100 %   No intake/output data recorded.  General Appearance:        No acute distress Lungs:                       Normal work of breathing on room air Heart:                                Regular rate and rhythm Abdomen:                         Soft, non-tender, non-distended Extremities:                      Warm and well perfused GU:          Foley catheter in place draining clear yellow urine, perineal incision c/d/I covered with [facility] Course:  The patient underwent insertion of male urethral sling on 01/10/2023.  The patient tolerated the procedure well, was extubated in the OR, and afterwards was taken to the PACU for routine post-surgical care. When stable the patient was transferred to the floor.   The patient did well postoperatively.  The patient's diet was slowly advanced and at the time of discharge was tolerating a regular diet.  The patient was discharged home 1 Day Post-Op, at which point was tolerating a regular  solid diet, was able to make adequate urine, have adequate pain control with P.O. pain medication, and could ambulate without difficulty. The patient will follow up with [place] for post op check and catheter removal.   Condition at Discharge: Improved  Discharge Medications:  Allergies as of 01/11/2023       Reactions   Norco [hydrocodone-acetaminophen] Swelling   Lip swelling after receiving Norco postoperatively on 04/25/2021   Losartan Itching, Rash        Medication List     STOP taking these medications    amLODipine-atorvastatin 5-10 MG tablet Commonly known as: CADUET   Dovato 50-300 MG tablet Generic drug: dolutegravir-lamiVUDine   gabapentin 300 MG capsule Commonly known as: NEURONTIN   sodium chloride 0.65 % Soln nasal spray Commonly known as: OCEAN   tadalafil 20 MG tablet Commonly known as: CIALIS   tiZANidine 4 MG tablet Commonly known as: ZANAFLEX       TAKE these medications    acetaminophen 500 MG tablet Commonly known as: TYLENOL Take 2 tablets (1,000 mg total) by mouth  every 6 (six) hours.   celecoxib 200 MG capsule Commonly known as: CELEBREX Take 1 capsule (200 mg total) by mouth 2 (two) times daily.   sulfamethoxazole-trimethoprim 800-160 MG tablet Commonly known as: BACTRIM DS Take 1 tablet by mouth every 12 (twelve) hours.   traMADol 50 MG tablet Commonly known as: ULTRAM Take 1 tablet (50 mg total) by mouth every 6 (six) hours as needed for moderate pain.

## 2023-05-16 ENCOUNTER — Other Ambulatory Visit: Payer: BC Managed Care – PPO

## 2023-05-16 DIAGNOSIS — C61 Malignant neoplasm of prostate: Secondary | ICD-10-CM

## 2023-05-17 ENCOUNTER — Other Ambulatory Visit: Payer: Self-pay

## 2023-05-17 DIAGNOSIS — C61 Malignant neoplasm of prostate: Secondary | ICD-10-CM

## 2023-05-17 LAB — PSA: Prostate Specific Ag, Serum: <0.1 ng/mL (ref 0.0–4.0)

## 2023-08-28 ENCOUNTER — Telehealth: Payer: Self-pay

## 2023-08-28 DIAGNOSIS — N5231 Erectile dysfunction following radical prostatectomy: Secondary | ICD-10-CM

## 2023-08-28 MED ORDER — TADALAFIL 20 MG PO TABS: 20.0000 mg | ORAL_TABLET | Freq: Every day | ORAL | 3 refills | Status: AC | PRN

## 2023-08-28 NOTE — Telephone Encounter (Signed)
Incoming request for medication refill, RX sent.

## 2023-11-16 ENCOUNTER — Other Ambulatory Visit: Payer: BC Managed Care – PPO

## 2023-11-29 ENCOUNTER — Ambulatory Visit: Payer: BC Managed Care – PPO | Admitting: Urology

## 2024-11-14 ENCOUNTER — Other Ambulatory Visit: Payer: Self-pay

## 2024-11-14 DIAGNOSIS — N5231 Erectile dysfunction following radical prostatectomy: Secondary | ICD-10-CM

## 2024-11-18 ENCOUNTER — Other Ambulatory Visit: Payer: Self-pay

## 2024-11-18 DIAGNOSIS — N5231 Erectile dysfunction following radical prostatectomy: Secondary | ICD-10-CM
